# Patient Record
Sex: Female | Born: 1955 | Race: Black or African American | Hispanic: No | Marital: Single | Smoking: Current every day smoker
Health system: Southern US, Community
[De-identification: ages and names within clinical notes are randomized; demographics above are authoritative.]

## PROBLEM LIST (undated history)

## (undated) DIAGNOSIS — L0390 Cellulitis, unspecified: Secondary | ICD-10-CM

## (undated) DIAGNOSIS — I10 Essential (primary) hypertension: Secondary | ICD-10-CM

---

## 2005-07-17 ENCOUNTER — Emergency Department: Payer: Self-pay | Admitting: Emergency Medicine

## 2011-03-04 ENCOUNTER — Emergency Department: Payer: Self-pay | Admitting: Emergency Medicine

## 2012-08-13 ENCOUNTER — Emergency Department: Payer: Self-pay | Admitting: Internal Medicine

## 2012-09-11 ENCOUNTER — Emergency Department: Payer: Self-pay | Admitting: Emergency Medicine

## 2020-05-18 ENCOUNTER — Other Ambulatory Visit: Payer: Self-pay | Admitting: Internal Medicine

## 2020-05-18 DIAGNOSIS — R17 Unspecified jaundice: Secondary | ICD-10-CM

## 2020-05-19 ENCOUNTER — Other Ambulatory Visit: Payer: Self-pay

## 2020-05-19 ENCOUNTER — Ambulatory Visit
Admission: RE | Admit: 2020-05-19 | Discharge: 2020-05-19 | Disposition: A | Discharge status: Home / Self Care | Payer: BLUE CROSS/BLUE SHIELD | Source: Ambulatory Visit | Attending: Internal Medicine | Admitting: Internal Medicine

## 2020-05-19 DIAGNOSIS — R17 Unspecified jaundice: Secondary | ICD-10-CM | POA: Diagnosis present

## 2020-05-20 ENCOUNTER — Inpatient Hospital Stay (HOSPITAL_COMMUNITY)
Admission: EM | Admit: 2020-05-20 | Discharge: 2020-05-23 | DRG: 446 | Disposition: A | Discharge status: Home / Self Care | Payer: BLUE CROSS/BLUE SHIELD | Attending: Family Medicine | Admitting: Family Medicine

## 2020-05-20 ENCOUNTER — Encounter (HOSPITAL_COMMUNITY): Payer: Self-pay | Admitting: *Deleted

## 2020-05-20 ENCOUNTER — Other Ambulatory Visit: Payer: Self-pay

## 2020-05-20 DIAGNOSIS — F17200 Nicotine dependence, unspecified, uncomplicated: Secondary | ICD-10-CM | POA: Diagnosis present

## 2020-05-20 DIAGNOSIS — K8020 Calculus of gallbladder without cholecystitis without obstruction: Secondary | ICD-10-CM

## 2020-05-20 DIAGNOSIS — R17 Unspecified jaundice: Secondary | ICD-10-CM | POA: Diagnosis not present

## 2020-05-20 DIAGNOSIS — Z20822 Contact with and (suspected) exposure to covid-19: Secondary | ICD-10-CM | POA: Diagnosis present

## 2020-05-20 DIAGNOSIS — I10 Essential (primary) hypertension: Secondary | ICD-10-CM

## 2020-05-20 DIAGNOSIS — K8050 Calculus of bile duct without cholangitis or cholecystitis without obstruction: Secondary | ICD-10-CM | POA: Diagnosis present

## 2020-05-20 HISTORY — DX: Essential (primary) hypertension: I10

## 2020-05-20 HISTORY — DX: Cellulitis, unspecified: L03.90

## 2020-05-20 LAB — COMPREHENSIVE METABOLIC PANEL
ALT: 149 U/L — ABNORMAL HIGH (ref 0–44)
AST: 111 U/L — ABNORMAL HIGH (ref 15–41)
Albumin: 3.1 g/dL — ABNORMAL LOW (ref 3.5–5.0)
Alkaline Phosphatase: 347 U/L — ABNORMAL HIGH (ref 38–126)
Anion gap: 11 (ref 5–15)
BUN: 11 mg/dL (ref 8–23)
CO2: 21 mmol/L — ABNORMAL LOW (ref 22–32)
Calcium: 9.4 mg/dL (ref 8.9–10.3)
Chloride: 107 mmol/L (ref 98–111)
Creatinine, Ser: 0.63 mg/dL (ref 0.44–1.00)
GFR, Estimated: >60 mL/min (ref >60)
Glucose, Bld: 82 mg/dL (ref 70–99)
Potassium: 3.3 mmol/L — ABNORMAL LOW (ref 3.5–5.1)
Sodium: 139 mmol/L (ref 135–145)
Total Bilirubin: 15.4 mg/dL — ABNORMAL HIGH (ref 0.3–1.2)
Total Protein: 6.7 g/dL (ref 6.5–8.1)

## 2020-05-20 LAB — CBC WITH DIFFERENTIAL/PLATELET
Abs Immature Granulocytes: 0.01 10*3/uL (ref 0.00–0.07)
Basophils Absolute: 0 10*3/uL (ref 0.0–0.1)
Basophils Relative: 0 %
Eosinophils Absolute: 0.1 10*3/uL (ref 0.0–0.5)
Eosinophils Relative: 2 %
HCT: 34.6 % — ABNORMAL LOW (ref 36.0–46.0)
Hemoglobin: 11.4 g/dL — ABNORMAL LOW (ref 12.0–15.0)
Immature Granulocytes: 0 %
Lymphocytes Relative: 25 %
Lymphs Abs: 1.6 10*3/uL (ref 0.7–4.0)
MCH: 26.6 pg (ref 26.0–34.0)
MCHC: 32.9 g/dL (ref 30.0–36.0)
MCV: 80.8 fL (ref 80.0–100.0)
Monocytes Absolute: 0.7 10*3/uL (ref 0.1–1.0)
Monocytes Relative: 12 %
Neutro Abs: 3.8 10*3/uL (ref 1.7–7.7)
Neutrophils Relative %: 61 %
Platelets: 323 10*3/uL (ref 150–400)
RBC: 4.28 MIL/uL (ref 3.87–5.11)
RDW: 20 % — ABNORMAL HIGH (ref 11.5–15.5)
WBC: 6.3 10*3/uL (ref 4.0–10.5)
nRBC: 0 % (ref 0.0–0.2)

## 2020-05-20 LAB — RESPIRATORY PANEL BY RT PCR (FLU A&B, COVID)
Influenza A by PCR: NEGATIVE
Influenza B by PCR: NEGATIVE
SARS Coronavirus 2 by RT PCR: NEGATIVE

## 2020-05-20 LAB — LIPASE, BLOOD: Lipase: 45 U/L (ref 11–51)

## 2020-05-20 LAB — BILIRUBIN, DIRECT: Bilirubin, Direct: 9.1 mg/dL — ABNORMAL HIGH (ref 0.0–0.2)

## 2020-05-20 LAB — PROTIME-INR
INR: 1 (ref 0.8–1.2)
Prothrombin Time: 12.6 seconds (ref 11.4–15.2)

## 2020-05-20 MED ORDER — ACETAMINOPHEN 325 MG PO TABS: 650.0000 mg | ORAL_TABLET | Freq: Four times a day (QID) | ORAL | Status: DC | PRN

## 2020-05-20 MED ORDER — ENOXAPARIN SODIUM 40 MG/0.4ML ~~LOC~~ SOLN
40.0000 mg | SUBCUTANEOUS | Status: DC
  Administered 2020-05-21 – 2020-05-23 (×3): 40 mg via SUBCUTANEOUS
  Filled 2020-05-20 (×3): qty 0.4

## 2020-05-20 MED ORDER — LORAZEPAM 2 MG/ML IJ SOLN: 1.0000 mg | Freq: Once | INTRAMUSCULAR | Status: DC

## 2020-05-20 MED ORDER — HYDRALAZINE HCL 20 MG/ML IJ SOLN
2.0000 mg | INTRAMUSCULAR | Status: DC | PRN
  Administered 2020-05-20: 2 mg via INTRAVENOUS
  Filled 2020-05-20: qty 1

## 2020-05-20 MED ORDER — SODIUM CHLORIDE 0.9 % IV BOLUS
1000.0000 mL | Freq: Once | INTRAVENOUS | Status: AC
Start: 2020-05-20 — End: 2020-05-20
  Administered 2020-05-20: 1000 mL via INTRAVENOUS

## 2020-05-20 MED ORDER — SODIUM CHLORIDE 0.9% FLUSH
3.0000 mL | Freq: Two times a day (BID) | INTRAVENOUS | Status: DC
  Administered 2020-05-21 – 2020-05-23 (×3): 3 mL via INTRAVENOUS

## 2020-05-20 MED ORDER — ACETAMINOPHEN 650 MG RE SUPP: 650.0000 mg | Freq: Four times a day (QID) | RECTAL | Status: DC | PRN

## 2020-05-20 NOTE — ED Notes (Signed)
Yellowish tint noted to pts eyes and skin. No complaints at this time.

## 2020-05-20 NOTE — ED Provider Notes (Signed)
[facility] Provider Note   CSN: [id number] Arrival date & time: 05/20/20  1751     History Chief Complaint  Patient presents with   Abdominal Pain    Shelly Roberts is a 64 y.o. female here presenting with painless jaundice.  Patient is otherwise healthy.  Patient states that for the last week or so she noticed that her eyes were yellow.  She had no abdominal pain or vomiting or fevers.  She had an outpatient labs done 2 days ago that showed AST of 97 and ALT of 152 and alk phos of 397 and T bili of 15.  She also had a outpatient ultrasound that showed dilated CBD of 18 with a stone in it.  Patient denies any weight loss or fevers.  Patient was told to come to the ER and her daughter wanted her to come here.  The history is provided by the patient.       History reviewed. No pertinent past medical history.  There are no problems to display for this patient.   History reviewed. No pertinent surgical history.   OB History   No obstetric history on file.     No family history on file.  Social History   Tobacco Use   Smoking status: Current Every Day Smoker   Smokeless tobacco: Never Used  Substance Use Topics   Alcohol use: Never   Drug use: Not on file    Home Medications Prior to Admission medications   Not on File    Allergies    Patient has no known allergies.  Review of Systems   Review of Systems  Gastrointestinal:       Jaundice   All other systems reviewed and are negative.   Physical Exam Updated Vital Signs BP (!) 199/93    Pulse 67    Temp 98.1 F (36.7 C) (Oral)    Resp 18    Ht $R'5\' 4"'iN$  (1.626 m)    Wt 66.2 kg    SpO2 99%    BMI 25.06 kg/m   Physical Exam Vitals and nursing note reviewed.  Constitutional:      Comments: Jaundice   HENT:     Head: Normocephalic.     Mouth/Throat:     Mouth: Mucous membranes are moist.  Eyes:     Comments: + scleral icterus   Cardiovascular:     Rate and  Rhythm: Normal rate and regular rhythm.     Heart sounds: Normal heart sounds.  Pulmonary:     Effort: Pulmonary effort is normal.     Breath sounds: Normal breath sounds.  Abdominal:     General: Abdomen is flat.     Palpations: Abdomen is soft.  Skin:    General: Skin is warm.  Neurological:     General: No focal deficit present.     Mental Status: She is oriented to person, place, and time.  Psychiatric:        Mood and Affect: Mood normal.        Behavior: Behavior normal.     ED Results / Procedures / Treatments   Labs (all labs ordered are listed, but only abnormal results are displayed) Labs Reviewed  RESPIRATORY PANEL BY RT PCR (FLU A&B, COVID)  CBC WITH DIFFERENTIAL/PLATELET  COMPREHENSIVE METABOLIC PANEL  LIPASE, BLOOD  BILIRUBIN, DIRECT  PROTIME-INR    EKG None  Radiology US Abdomen Complete  Result Date: 05/19/2020 CLINICAL DATA:  64 year old female with  painless jaundice. EXAM: ABDOMEN ULTRASOUND COMPLETE COMPARISON:  None. FINDINGS: Gallbladder: Multiple shadowing echogenic gallstones, individually up to 14 mm (image 12). Superimposed layering sludge (image 6). Gallbladder wall thickness remains normal. No sonographic Murphy sign elicited. Common bile duct: Diameter: 11 mm, dilated. And a bulky 18 mm shadowing stone is identified within the distal visible duct on image 19. Liver: Moderate to severe intrahepatic ductal dilatation (image 35). No superimposed discrete liver lesion. Background liver echogenicity within normal limits. Portal vein is patent on color Doppler imaging with normal direction of blood flow towards the liver. IVC: No abnormality visualized. Pancreas: Visible pancreatic head and body appear normal (image 43). No pancreatic ductal dilatation is evident. Spleen: Size and appearance within normal limits. Right Kidney: Length: 11.1 cm. Echogenicity within normal limits. No mass or hydronephrosis visualized. Left Kidney: Length: 10.4 cm. Echogenicity  within normal limits. No mass or hydronephrosis visualized. Abdominal aorta: No aneurysm visualized. Other findings: None. IMPRESSION: 1. Dilated CBD and intrahepatic ducts, with evidence of an 18 mm obstructing stone in the mid to distal CBD (choledocholithiasis, image 19). 2. Superimposed gallstones and sludge. No evidence of acute cholecystitis. 3. Visible pancreas has a normal ultrasound appearance. These results will be called to the ordering clinician or representative by the Radiologist Assistant, and communication documented in the PACS or [employer]. Electronically Signed   By: Genevie Ann M.D.   On: 05/19/2020 11:21    Procedures Procedures (including critical care time)  Medications Ordered in ED Medications  sodium chloride 0.9 % bolus 1,000 mL (has no administration in time range)    ED Course  I have reviewed the triage vital signs and the nursing notes.  Pertinent labs & imaging results that were available during my care of the patient were reviewed by me and considered in my medical decision making (see chart for details).    MDM Rules/Calculators/A&P                         Shelly Roberts is a 64 y.o. female who presented with painless jaundice.  Patient noticed jaundice for the last week or so.  Ultrasound showed dilated CBD with an obstructing stone.  Patient has no abdominal pain or tenderness.  Patient actually has no weight loss.  Will consult GI and likely will need ERCP or MRCP. We will repeat LFTs and also get a PT/INR since her liver function was elevated.  9:40 PM Talked to Dr. Watt Climes who recommend MRCP and admission.   10:37 PM CBC unremarkable. Liver function tests pending. Hospitalist to admit for choledocholithiasis.   Final Clinical Impression(s) / ED Diagnoses Final diagnoses:  None    Rx / DC Orders ED Discharge Orders    None       Drenda Freeze, MD 05/20/20 2237

## 2020-05-20 NOTE — H&P (Signed)
History and Physical   Shelly Roberts JME:[id number] DOB: 10-30-1955 DOA: 05/20/2020  PCP: Adin Hector, MD   Patient coming from: Home  Chief Complaint: Painless jaundice  HPI: Shelly Roberts is a 64 y.o. female with medical history significant of hypertension not on medical therapy who presents after several days of progressive painless jaundice.  Patient and family noticed that her skin had began to yellow on 11/17 and this has been progressive.  Denies any pain or other symptoms.  Was seen by physician at [facility] clinic 2 days ago who obtained labs and ultrasound of the abdomen.  Labs showed AST 97, ALT 152, alk phos 397, and T bili of 15.  Ultrasound showed an 18 mm stone in the common bile duct.  Patient was instructed to proceed to the ED.  Patient has come to [facility] on the advice of her daughter who works as a Immunologist.  Patient denies fever, cough, chest pain, shortness of breath, abdominal pain, nausea, constipation, diarrhea.  Does report loose stool starting today as well as yellowing of her urine.  ED Course: Vitals in the ED significant for blood pressure of 229N to 989Q systolic.  CBC showed hemoglobin 11.4.  PT/INR within normal limits.  CMP pending at this time though previous CMP results are above.  Lipase pending, respiratory panel for flu and Covid pending.  Recent ultrasound as above.  MRCP ordered pending.  GI consulted in the ED.  Review of Systems: As per HPI otherwise all other systems reviewed and are negative.  Past Medical History:  Diagnosis Date  . Cellulitis   . Hypertension     History reviewed. No pertinent surgical history.  Social History  reports that she has been smoking. She has never used smokeless tobacco. She reports that she does not drink alcohol. No history on file for drug use.  No Known Allergies  No family history on file. Reviewed on admission  Prior to Admission medications   Not on File    Physical Exam: Vitals:   05/20/20  2000 05/20/20 2100 05/20/20 2115 05/20/20 2215  BP: (!) 180/80 (!) 163/117 (!) 199/93 (!) 187/76  Pulse: 86 60 67 67  Resp: $Remo'17 13 18 19  'ePLAZ$ Temp:      TempSrc:      SpO2: 99% 100% 99% 100%  Weight:      Height:       Physical Exam Constitutional:      General: She is not in acute distress.    Appearance: Normal appearance.  HENT:     Head: Normocephalic and atraumatic.     Mouth/Throat:     Mouth: Mucous membranes are moist.     Pharynx: Oropharynx is clear.  Eyes:     General: Scleral icterus present.     Extraocular Movements: Extraocular movements intact.     Pupils: Pupils are equal, round, and reactive to light.  Cardiovascular:     Rate and Rhythm: Normal rate and regular rhythm.     Pulses: Normal pulses.     Heart sounds: Normal heart sounds.  Pulmonary:     Effort: Pulmonary effort is normal. No respiratory distress.     Breath sounds: Normal breath sounds.  Abdominal:     General: Bowel sounds are normal. There is no distension.     Palpations: Abdomen is soft.     Tenderness: There is no abdominal tenderness.  Musculoskeletal:        General: No swelling or deformity.  Skin:    General: Skin is warm and dry.     Coloration: Skin is jaundiced.  Neurological:     General: No focal deficit present.     Mental Status: Mental status is at baseline.    Labs on Admission: I have personally reviewed following labs and imaging studies  CBC: Recent Labs  Lab 05/20/20 2131  WBC 6.3  NEUTROABS 3.8  HGB 11.4*  HCT 34.6*  MCV 80.8  PLT 161    Basic Metabolic Panel: Recent Labs  Lab 05/20/20 2131  NA 139  K 3.3*  CL 107  CO2 21*  GLUCOSE 82  BUN 11  CREATININE 0.63  CALCIUM 9.4    GFR: Estimated Creatinine Clearance: 69.1 mL/min (by C-G formula based on SCr of 0.63 mg/dL).  Liver Function Tests: Recent Labs  Lab 05/20/20 2131  AST 111*  ALT 149*  ALKPHOS 347*  BILITOT 15.4*  PROT 6.7  ALBUMIN 3.1*    Urine analysis: No results found  for: COLORURINE, APPEARANCEUR, LABSPEC, PHURINE, GLUCOSEU, HGBUR, BILIRUBINUR, KETONESUR, PROTEINUR, UROBILINOGEN, NITRITE, LEUKOCYTESUR  Radiological Exams on Admission: US Abdomen Complete  Result Date: 05/19/2020 CLINICAL DATA:  64 year old female with painless jaundice. EXAM: ABDOMEN ULTRASOUND COMPLETE COMPARISON:  None. FINDINGS: Gallbladder: Multiple shadowing echogenic gallstones, individually up to 14 mm (image 12). Superimposed layering sludge (image 6). Gallbladder wall thickness remains normal. No sonographic Murphy sign elicited. Common bile duct: Diameter: 11 mm, dilated. And a bulky 18 mm shadowing stone is identified within the distal visible duct on image 19. Liver: Moderate to severe intrahepatic ductal dilatation (image 35). No superimposed discrete liver lesion. Background liver echogenicity within normal limits. Portal vein is patent on color Doppler imaging with normal direction of blood flow towards the liver. IVC: No abnormality visualized. Pancreas: Visible pancreatic head and body appear normal (image 43). No pancreatic ductal dilatation is evident. Spleen: Size and appearance within normal limits. Right Kidney: Length: 11.1 cm. Echogenicity within normal limits. No mass or hydronephrosis visualized. Left Kidney: Length: 10.4 cm. Echogenicity within normal limits. No mass or hydronephrosis visualized. Abdominal aorta: No aneurysm visualized. Other findings: None. IMPRESSION: 1. Dilated CBD and intrahepatic ducts, with evidence of an 18 mm obstructing stone in the mid to distal CBD (choledocholithiasis, image 19). 2. Superimposed gallstones and sludge. No evidence of acute cholecystitis. 3. Visible pancreas has a normal ultrasound appearance. These results will be called to the ordering clinician or representative by the Radiologist Assistant, and communication documented in the PACS or [employer]. Electronically Signed   By: Genevie Ann M.D.   On: 05/19/2020 11:21    EKG: Not yet  obtained  Assessment/Plan Active Problems:   Choledocholithiasis   Jaundice   Essential hypertension  Choledocholithiasis > 1 week of progressive painless jaundice as per HPI > Labs 2 days ago at outpatient doctor's office showed elevated LFTs including alk phos of 397 and T bili 15. > Outpatient abdominal ultrasound showed 18 mm gallstone in the common bile duct > GI consulted in the ED who recommended MRCP and will see the patient in the morning > No evidence of cholangitis nor cholecystitis. Will need cholecystectomy (possibly elective, pending work-up) - Appreciate GI recommendations - Supportive care - MRCP - N.p.o. at midnight, ERCP  Hypertension > Known history of hypertension not on medication, hypertensive to the 160s to 200s in ED - PRN hydralazine for SBP >096 systolic  DVT prophylaxis: Lovenox Code Status:   Full  Family Communication:  Discussed with daughter Judeen Hammans, at  bedside.  Of note daughter works at [employer].  Disposition Plan:   Patient is from:  Home  Anticipated DC to:  Home  Anticipated DC date:  Pending clinical course  Anticipated DC barriers: None  Consults called:  GI consulted by EDP  Admission status:  Inpatient, telemetry   Severity of Illness: The appropriate patient status for this patient is INPATIENT. Inpatient status is judged to be reasonable and necessary in order to provide the required intensity of service to ensure the patient's safety. The patient's presenting symptoms, physical exam findings, and initial radiographic and laboratory data in the context of their chronic comorbidities is felt to place them at high risk for further clinical deterioration. Furthermore, it is not anticipated that the patient will be medically stable for discharge from the hospital within 2 midnights of admission. The following factors support the patient status of inpatient.   " The patient's presenting symptoms include jaundice. " The worrisome physical exam  findings include jaundice. " The initial radiographic and laboratory data are worrisome because of elevated LFTs including elevated T bili and alk phos. " The chronic co-morbidities include hypertension.   * I certify that at the point of admission it is my clinical judgment that the patient will require inpatient hospital care spanning beyond 2 midnights from the point of admission due to high intensity of service, high risk for further deterioration and high frequency of surveillance required.Marcelyn Bruins MD [facility]  How to contact the [facility] Attending or Consulting provider [place] or covering provider during after hours [place], for this patient?   1. Check the care team in [facility] and look for a) attending/consulting [facility] provider listed and b) the [facility] team listed 2. Log into [email or web address] and use 's universal password to access. If you do not have the password, please contact the hospital operator. 3. Locate the [facility] provider you are looking for under [facility] and page to a number that you can be directly reached. 4. If you still have difficulty reaching the provider, please page the [facility] (Director on Call) for the Hospitalists listed on amion for assistance.  05/20/2020, 11:00 PM

## 2020-05-20 NOTE — ED Triage Notes (Signed)
The pt was diagnosed with gallstones this past Thursday  In [place] she was told to come here today  She is in no pain she just has some itching in her lower abdomen

## 2020-05-21 ENCOUNTER — Inpatient Hospital Stay (HOSPITAL_COMMUNITY): Payer: BLUE CROSS/BLUE SHIELD

## 2020-05-21 ENCOUNTER — Encounter (HOSPITAL_COMMUNITY): Payer: Self-pay | Admitting: Internal Medicine

## 2020-05-21 DIAGNOSIS — K8050 Calculus of bile duct without cholangitis or cholecystitis without obstruction: Secondary | ICD-10-CM | POA: Diagnosis not present

## 2020-05-21 LAB — COMPREHENSIVE METABOLIC PANEL
ALT: 138 U/L — ABNORMAL HIGH (ref 0–44)
AST: 108 U/L — ABNORMAL HIGH (ref 15–41)
Albumin: 2.9 g/dL — ABNORMAL LOW (ref 3.5–5.0)
Alkaline Phosphatase: 318 U/L — ABNORMAL HIGH (ref 38–126)
Anion gap: 10 (ref 5–15)
BUN: <5 mg/dL — ABNORMAL LOW (ref 8–23)
CO2: 21 mmol/L — ABNORMAL LOW (ref 22–32)
Calcium: 9.1 mg/dL (ref 8.9–10.3)
Chloride: 108 mmol/L (ref 98–111)
Creatinine, Ser: 0.6 mg/dL (ref 0.44–1.00)
GFR, Estimated: >60 mL/min (ref >60)
Glucose, Bld: 104 mg/dL — ABNORMAL HIGH (ref 70–99)
Potassium: 3 mmol/L — ABNORMAL LOW (ref 3.5–5.1)
Sodium: 139 mmol/L (ref 135–145)
Total Bilirubin: 15.5 mg/dL — ABNORMAL HIGH (ref 0.3–1.2)
Total Protein: 6.1 g/dL — ABNORMAL LOW (ref 6.5–8.1)

## 2020-05-21 LAB — CBC
HCT: 33.1 % — ABNORMAL LOW (ref 36.0–46.0)
Hemoglobin: 11.2 g/dL — ABNORMAL LOW (ref 12.0–15.0)
MCH: 27.1 pg (ref 26.0–34.0)
MCHC: 33.8 g/dL (ref 30.0–36.0)
MCV: 80.1 fL (ref 80.0–100.0)
Platelets: 312 10*3/uL (ref 150–400)
RBC: 4.13 MIL/uL (ref 3.87–5.11)
RDW: 19.9 % — ABNORMAL HIGH (ref 11.5–15.5)
WBC: 6.2 10*3/uL (ref 4.0–10.5)
nRBC: 0 % (ref 0.0–0.2)

## 2020-05-21 LAB — HIV ANTIBODY (ROUTINE TESTING W REFLEX): HIV Screen 4th Generation wRfx: NONREACTIVE

## 2020-05-21 MED ORDER — POTASSIUM CHLORIDE 10 MEQ/100ML IV SOLN
10.0000 meq | INTRAVENOUS | Status: AC
  Administered 2020-05-21 (×3): 10 meq via INTRAVENOUS
  Filled 2020-05-21 (×3): qty 100

## 2020-05-21 MED ORDER — GADOBUTROL 1 MMOL/ML IV SOLN
7.0000 mL | Freq: Once | INTRAVENOUS | Status: AC | PRN
  Administered 2020-05-21: 7 mL via INTRAVENOUS

## 2020-05-21 MED ORDER — SODIUM CHLORIDE 0.9 % IV SOLN: INTRAVENOUS | Status: DC

## 2020-05-21 NOTE — Consult Note (Signed)
Reason for Consult: Obstructive jaundice probable CBD stone Referring Physician: Hospital team  Shelly Roberts is an 64 y.o. female.  HPI: Patient seen and examined in hospital computer chart reviewed and case discussed with hospital team as well as the patient's daughter on the phone and she has not had any previous GI issues or work-up and specifically has not had an endoscopy or colonoscopy before and was unaware that she had gallstones and has never had a liver problem and has not donated blood and her daughter who is a nurse thought she was jaundice and liver test confirmed that an ultrasound showed gallstones and probable CBD stone and she presented to the emergency room and MRCP was done which confirmed the problem and specifically the patient has not had any pain weight loss change in bowel habits fever chills night sweats or any other complaint and had only noticed dark urine for 1 day and did wake up itching yesterday but that is better today  Past Medical History:  Diagnosis Date  . Cellulitis   . Hypertension     History reviewed. No pertinent surgical history.  History reviewed. No pertinent family history.  Social History:  reports that she has been smoking. She has never used smokeless tobacco. She reports that she does not drink alcohol. No history on file for drug use.  Allergies: No Known Allergies  Medications: I have reviewed the patient's current medications.  Results for orders placed or performed during the hospital encounter of 05/20/20 (from the past 48 hour(s))  CBC with Differential/Platelet     Status: Abnormal   Collection Time: 05/20/20  9:31 PM  Result Value Ref Range   WBC 6.3 4.0 - 10.5 K/uL   RBC 4.28 3.87 - 5.11 MIL/uL   Hemoglobin 11.4 (L) 12.0 - 15.0 g/dL   HCT 16.134.6 (L) 36 - 46 %   MCV 80.8 80.0 - 100.0 fL   MCH 26.6 26.0 - 34.0 pg   MCHC 32.9 30.0 - 36.0 g/dL   RDW 09.620.0 (H) 04.511.5 - 40.915.5 %   Platelets 323 150 - 400 K/uL   nRBC 0.0 0.0 - 0.2 %    Neutrophils Relative % 61 %   Neutro Abs 3.8 1.7 - 7.7 K/uL   Lymphocytes Relative 25 %   Lymphs Abs 1.6 0.7 - 4.0 K/uL   Monocytes Relative 12 %   Monocytes Absolute 0.7 0.1 - 1.0 K/uL   Eosinophils Relative 2 %   Eosinophils Absolute 0.1 0.0 - 0.5 K/uL   Basophils Relative 0 %   Basophils Absolute 0.0 0.0 - 0.1 K/uL   Immature Granulocytes 0 %   Abs Immature Granulocytes 0.01 0.00 - 0.07 K/uL    Comment: Performed at [facility], [address] [place], [place] [id number]  Comprehensive metabolic panel     Status: Abnormal   Collection Time: 05/20/20  9:31 PM  Result Value Ref Range   Sodium 139 135 - 145 mmol/L   Potassium 3.3 (L) 3.5 - 5.1 mmol/L   Chloride 107 98 - 111 mmol/L   CO2 21 (L) 22 - 32 mmol/L   Glucose, Bld 82 70 - 99 mg/dL    Comment: Glucose reference range applies only to samples taken after fasting for at least 8 hours.   BUN 11 8 - 23 mg/dL   Creatinine, Ser 4.780.63 0.44 - 1.00 mg/dL   Calcium 9.4 8.9 - 29.510.3 mg/dL   Total Protein 6.7 6.5 - 8.1 g/dL  Albumin 3.1 (L) 3.5 - 5.0 g/dL   AST 578 (H) 15 - 41 U/L   ALT 149 (H) 0 - 44 U/L   Alkaline Phosphatase 347 (H) 38 - 126 U/L   Total Bilirubin 15.4 (H) 0.3 - 1.2 mg/dL   GFR, Estimated >46 >96 mL/min    Comment: (NOTE) Calculated using the CKD-EPI Creatinine Equation (2021)    Anion gap 11 5 - 15    Comment: Performed at [facility], [address] [place], [place] [address]  Lipase, blood     Status: None   Collection Time: 05/20/20  9:31 PM  Result Value Ref Range   Lipase 45 11 - 51 U/L    Comment: Performed at [facility], [address], [place], [place] [address]  Bilirubin, direct     Status: Abnormal   Collection Time: 05/20/20  9:31 PM  Result Value Ref Range   Bilirubin, Direct 9.1 (H) 0.0 - 0.2 mg/dL    Comment: Performed at [facility], [address], [place], [place] [address]  Protime-INR     Status: None   Collection Time: 05/20/20  9:31 PM  Result Value Ref  Range   Prothrombin Time 12.6 11.4 - 15.2 seconds   INR 1.0 0.8 - 1.2    Comment: (NOTE) INR goal varies based on device and disease states. Performed at [facility], [address], [place], [place] [address]   Respiratory Panel by RT PCR (Flu A&B, Covid) - Nasopharyngeal Swab     Status: None   Collection Time: 05/20/20  9:44 PM   Specimen: Nasopharyngeal Swab; Nasopharyngeal(NP) swabs in vial transport medium  Result Value Ref Range   SARS Coronavirus 2 by RT PCR NEGATIVE NEGATIVE    Comment: (NOTE) SARS-CoV-2 target nucleic acids are NOT DETECTED.  The SARS-CoV-2 RNA is generally detectable in upper respiratoy specimens during the acute phase of infection. The lowest concentration of SARS-CoV-2 viral copies this assay can detect is 131 copies/mL. A negative result does not preclude SARS-Cov-2 infection and should not be used as the sole basis for treatment or other patient management decisions. A negative result may occur with  improper specimen collection/handling, submission of specimen other than nasopharyngeal swab, presence of viral mutation(s) within the areas targeted by this assay, and inadequate number of viral copies (<131 copies/mL). A negative result must be combined with clinical observations, patient history, and epidemiological information. The expected result is Negative.  Fact Sheet for Patients:  https://www.moore.com/  Fact Sheet for Healthcare Providers:  https://www.young.biz/  This test is no t yet approved or cleared by the [place] FDA and  has been authorized for detection and/or diagnosis of SARS-CoV-2 by FDA under an Emergency Use Authorization (EUA). This EUA will remain  in effect (meaning this test can be used) for the duration of the COVID-19 declaration under Section 564(b)(1) of the Act, 21 U.S.C. section 360bbb-3(b)(1), unless the authorization is terminated or revoked sooner.      Influenza A by PCR NEGATIVE NEGATIVE   Influenza B by PCR NEGATIVE NEGATIVE    Comment: (NOTE) The Xpert Xpress SARS-CoV-2/FLU/RSV assay is intended as an aid in  the diagnosis of influenza from Nasopharyngeal swab specimens and  should not be used as a sole basis for treatment. Nasal washings and  aspirates are unacceptable for Xpert Xpress SARS-CoV-2/FLU/RSV  testing.  Fact Sheet for Patients: https://www.moore.com/  Fact Sheet for Healthcare Providers: https://www.young.biz/  This test is not  yet approved or cleared by the [place] and  has been authorized for detection and/or diagnosis of SARS-CoV-2 by  FDA under an Emergency Use Authorization (EUA). This EUA will remain  in effect (meaning this test can be used) for the duration of the  Covid-19 declaration under Section 564(b)(1) of the Act, 21  U.S.C. section 360bbb-3(b)(1), unless the authorization is  terminated or revoked. Performed at [facility], [address] [place], [place] [address]   HIV Antibody (routine testing w rflx)     Status: None   Collection Time: 05/20/20 11:58 PM  Result Value Ref Range   HIV Screen 4th Generation wRfx Non Reactive Non Reactive    Comment: Performed at [facility], [address] [place], [place] [address]  Comprehensive metabolic panel     Status: Abnormal   Collection Time: 05/21/20  5:36 AM  Result Value Ref Range   Sodium 139 135 - 145 mmol/L   Potassium 3.0 (L) 3.5 - 5.1 mmol/L   Chloride 108 98 - 111 mmol/L   CO2 21 (L) 22 - 32 mmol/L   Glucose, Bld 104 (H) 70 - 99 mg/dL    Comment: Glucose reference range applies only to samples taken after fasting for at least 8 hours.   BUN <5 (L) 8 - 23 mg/dL   Creatinine, Ser 8.18 0.44 - 1.00 mg/dL   Calcium 9.1 8.9 - 56.3 mg/dL   Total Protein 6.1 (L) 6.5 - 8.1 g/dL   Albumin 2.9 (L) 3.5 - 5.0 g/dL   AST 149 (H) 15 - 41 U/L   ALT 138 (H) 0 - 44 U/L   Alkaline  Phosphatase 318 (H) 38 - 126 U/L   Total Bilirubin 15.5 (H) 0.3 - 1.2 mg/dL   GFR, Estimated >70 >26 mL/min    Comment: (NOTE) Calculated using the CKD-EPI Creatinine Equation (2021)    Anion gap 10 5 - 15    Comment: Performed at [facility], [address], [place], [place] [address]  CBC     Status: Abnormal   Collection Time: 05/21/20  5:36 AM  Result Value Ref Range   WBC 6.2 4.0 - 10.5 K/uL   RBC 4.13 3.87 - 5.11 MIL/uL   Hemoglobin 11.2 (L) 12.0 - 15.0 g/dL   HCT 85.0 (L) 36 - 46 %   MCV 80.1 80.0 - 100.0 fL   MCH 27.1 26.0 - 34.0 pg   MCHC 33.8 30.0 - 36.0 g/dL   RDW 27.7 (H) 41.2 - 87.8 %   Platelets 312 150 - 400 K/uL   nRBC 0.0 0.0 - 0.2 %    Comment: Performed at [facility], [address], [place], [place] [address]    US Abdomen Complete  Result Date: 05/19/2020 CLINICAL DATA:  64 year old female with painless jaundice. EXAM: ABDOMEN ULTRASOUND COMPLETE COMPARISON:  None. FINDINGS: Gallbladder: Multiple shadowing echogenic gallstones, individually up to 14 mm (image 12). Superimposed layering sludge (image 6). Gallbladder wall thickness remains normal. No sonographic Murphy sign elicited. Common bile duct: Diameter: 11 mm, dilated. And a bulky 18 mm shadowing stone is identified within the distal visible duct on image 19. Liver: Moderate to severe intrahepatic ductal dilatation (image 35). No superimposed discrete liver lesion. Background liver echogenicity within normal limits. Portal vein is patent on color Doppler imaging with normal direction of blood flow towards the liver. IVC: No abnormality visualized. Pancreas: Visible pancreatic head and body appear normal (image 43).  No pancreatic ductal dilatation is evident. Spleen: Size and appearance within normal limits. Right Kidney: Length: 11.1 cm. Echogenicity within normal limits. No mass or hydronephrosis visualized. Left Kidney: Length: 10.4 cm. Echogenicity within normal limits. No mass or hydronephrosis  visualized. Abdominal aorta: No aneurysm visualized. Other findings: None. IMPRESSION: 1. Dilated CBD and intrahepatic ducts, with evidence of an 18 mm obstructing stone in the mid to distal CBD (choledocholithiasis, image 19). 2. Superimposed gallstones and sludge. No evidence of acute cholecystitis. 3. Visible pancreas has a normal ultrasound appearance. These results will be called to the ordering clinician or representative by the Radiologist Assistant, and communication documented in the PACS or [employer]. Electronically Signed   By: Odessa Fleming M.D.   On: 05/19/2020 11:21   MR ABDOMEN MRCP W WO CONTAST  Result Date: 05/21/2020 CLINICAL DATA:  Abdominal pain, biliary obstruction, CBD stone on ultrasound EXAM: MRI ABDOMEN WITHOUT AND WITH CONTRAST (INCLUDING MRCP) TECHNIQUE: Multiplanar multisequence MR imaging of the abdomen was performed both before and after the administration of intravenous contrast. Heavily T2-weighted images of the biliary and pancreatic ducts were obtained, and three-dimensional MRCP images were rendered by post processing. CONTRAST:  11mL GADAVIST GADOBUTROL 1 MMOL/ML IV SOLN COMPARISON:  Abdominal ultrasound, 05/19/2020 FINDINGS: Lower chest: No acute findings. Hepatobiliary: No mass or other parenchymal abnormality identified. There is severe intra and extrahepatic biliary ductal dilatation. There is a large gallstone present in the midportion of the common bile duct, approximately 3.5 cm from the ampulla, measuring 1.5 x 1.3 cm (series 5, image 15). Multiple additional gallstones and sludge in the gallbladder. Pancreas: No mass, inflammatory changes, or other parenchymal abnormality identified. No pancreatic ductal dilatation. Spleen:  Within normal limits in size and appearance. Adrenals/Urinary Tract: No masses identified. No evidence of hydronephrosis. Stomach/Bowel: Visualized portions within the abdomen are unremarkable. Vascular/Lymphatic: No pathologically enlarged lymph  nodes identified. No abdominal aortic aneurysm demonstrated. Other:  None. Musculoskeletal: No suspicious bone lesions identified. IMPRESSION: 1. Severe intra and extrahepatic biliary ductal dilatation. There is a large gallstone in the midportion of the common bile duct, approximately 3.5 cm from the ampulla, measuring 1.5 x 1.3 cm. 2. Multiple additional gallstones and sludge in the gallbladder. Preliminary findings were reported by Dr. Kreg Shropshire at 2:36 a.m., 07/22/2019. Electronically Signed   By: Lauralyn Primes M.D.   On: 05/21/2020 10:42    Review of Systems negative except above Blood pressure (!) 145/71, pulse 60, temperature 98.5 F (36.9 C), temperature source Oral, resp. rate 18, height 5\' 4"  (1.626 m), weight 66.2 kg, SpO2 97 %. Physical Exam vital signs stable afebrile no acute distress exam pertinent for her abdomen being soft nontender labs MRI and ultrasound report reviewed  Assessment/Plan: Obstructive jaundice probably secondary to large CBD stone Plan: The risk benefits methods and success rate of ERCP sphincterotomy possible stenting was discussed with both the patient and her daughter and will proceed tomorrow at probably 10 AM with further work-up and plans pending those findings and we did discuss how ultimately she will need her gallbladder out but can probably wait till after Thanksgiving unless symptoms change and have it done electively in the future  [facility] E 05/21/2020, 10:53 AM

## 2020-05-21 NOTE — ED Notes (Signed)
Assuming care of patient at this time. Pt in NAD. Bed in low position. Call bell within reach.

## 2020-05-21 NOTE — Progress Notes (Signed)
PROGRESS NOTE    Shelly Roberts  ASN:[id number] DOB: January 31, 1956 DOA: 05/20/2020 PCP: Lynnea Ferrier, MD    Brief Narrative:  This 64 years old female with medical history significant for hypertension but not on any medications presents with several day history of progressive painless jaundice.  She has seen the physician at [facility] clinic 2 days ago who ordered labs and ultrasound which showed elevated liver enzymes with 18 mm stone in the common bile duct.  Patient was sent to the ED.  She is admitted for choledocholithiasis.  GI is consulted. She is scheduled to have ERCP tomorrow.  Assessment & Plan:   Active Problems:   Choledocholithiasis   Jaundice   Essential hypertension   Choledocholithiasis: Patient presents with 1 week history of progressive painless jaundice . Patient denies any symptoms, nausea, vomiting or abdominal pain. She is seen physician in [place] clinic 2 days ago who ordered the labs. Outpatient abdominal ultrasound showed 18 mm gallstone in the common bile duct GI consulted in the ED who recommended MRCP :  MRCP: Severe intra and extrahepatic biliary ductal dilatation. There is a large gallstone in the midportion of the common bile duct. No evidence of cholangitis nor cholecystitis.  Patient is n.p.o. midnight scheduled for ERCP tomorrow. Will eventually need cholecystectomy after Thanksgiving.  Hypertension She report history of hypertension but not on any blood pressure medications. We will continue hydralazine as needed as needed.  DVT prophylaxis: Lovenox Code Status: Full code Family Communication: None at bedside Disposition Plan:     Status is: Inpatient  Remains inpatient appropriate because:Inpatient level of care appropriate due to severity of illness   Dispo: The patient is from: Home              Anticipated d/c is to: [facility]              Anticipated d/c date is: 2 days              Patient currently is not medically stable to  d/c.  Consultants:   Gastroenterology  Procedures: Scheduled ERCP 11/ 22 Antimicrobials:   Anti-infectives (From admission, onward)   None      Subjective: Patient was seen and examined at bedside.  Overnight events noted.  Patient denies any abdominal pain nausea vomiting.  LFTs are trending down.  Objective: Vitals:   05/21/20 0427 05/21/20 0449 05/21/20 0851 05/21/20 1209  BP: (!) 153/96 (!) 166/99 (!) 145/71 (!) 159/87  Pulse: (!) 57 (!) 56 60 (!) 56  Resp: 17 18  17   Temp: 98 F (36.7 C) 98.5 F (36.9 C)  98.7 F (37.1 C)  TempSrc: Oral Oral  Oral  SpO2: 100% 97%  99%  Weight:      Height:        Intake/Output Summary (Last 24 hours) at 05/21/2020 1512 Last data filed at 05/21/2020 1500 Gross per 24 hour  Intake 1017.78 ml  Output --  Net 1017.78 ml   Filed Weights   05/20/20 1801  Weight: 66.2 kg    Examination:  General exam: Appears calm and comfortable  Respiratory system: Clear to auscultation. Respiratory effort normal. Cardiovascular system: S1 & S2 heard, RRR. No JVD, murmurs, rubs, gallops or clicks. No pedal edema. Gastrointestinal system: Abdomen is nondistended, soft and nontender. No organomegaly or masses felt. Normal bowel sounds heard. Central nervous system: Alert and oriented. No focal neurological deficits. Extremities: No edema, no cyanosis, no clubbing Skin: No rashes, lesions or ulcers Psychiatry:  Judgement and insight appear normal. Mood & affect appropriate.     Data Reviewed: I have personally reviewed following labs and imaging studies  CBC: Recent Labs  Lab 05/20/20 2131 05/21/20 0536  WBC 6.3 6.2  NEUTROABS 3.8  --   HGB 11.4* 11.2*  HCT 34.6* 33.1*  MCV 80.8 80.1  PLT 323 312   Basic Metabolic Panel: Recent Labs  Lab 05/20/20 2131 05/21/20 0536  NA 139 139  K 3.3* 3.0*  CL 107 108  CO2 21* 21*  GLUCOSE 82 104*  BUN 11 <5*  CREATININE 0.63 0.60  CALCIUM 9.4 9.1   GFR: Estimated Creatinine  Clearance: 69.1 mL/min (by C-G formula based on SCr of 0.6 mg/dL). Liver Function Tests: Recent Labs  Lab 05/20/20 2131 05/21/20 0536  AST 111* 108*  ALT 149* 138*  ALKPHOS 347* 318*  BILITOT 15.4* 15.5*  PROT 6.7 6.1*  ALBUMIN 3.1* 2.9*   Recent Labs  Lab 05/20/20 2131  LIPASE 45   No results for input(s): AMMONIA in the last 168 hours. Coagulation Profile: Recent Labs  Lab 05/20/20 2131  INR 1.0   Cardiac Enzymes: No results for input(s): CKTOTAL, CKMB, CKMBINDEX, TROPONINI in the last 168 hours. BNP (last 3 results) No results for input(s): PROBNP in the last 8760 hours. HbA1C: No results for input(s): HGBA1C in the last 72 hours. CBG: No results for input(s): GLUCAP in the last 168 hours. Lipid Profile: No results for input(s): CHOL, HDL, LDLCALC, TRIG, CHOLHDL, LDLDIRECT in the last 72 hours. Thyroid Function Tests: No results for input(s): TSH, T4TOTAL, FREET4, T3FREE, THYROIDAB in the last 72 hours. Anemia Panel: No results for input(s): VITAMINB12, FOLATE, FERRITIN, TIBC, IRON, RETICCTPCT in the last 72 hours. Sepsis Labs: No results for input(s): PROCALCITON, LATICACIDVEN in the last 168 hours.  Recent Results (from the past 240 hour(s))  Respiratory Panel by RT PCR (Flu A&B, Covid) - Nasopharyngeal Swab     Status: None   Collection Time: 05/20/20  9:44 PM   Specimen: Nasopharyngeal Swab; Nasopharyngeal(NP) swabs in vial transport medium  Result Value Ref Range Status   SARS Coronavirus 2 by RT PCR NEGATIVE NEGATIVE Final    Comment: (NOTE) SARS-CoV-2 target nucleic acids are NOT DETECTED.  The SARS-CoV-2 RNA is generally detectable in upper respiratoy specimens during the acute phase of infection. The lowest concentration of SARS-CoV-2 viral copies this assay can detect is 131 copies/mL. A negative result does not preclude SARS-Cov-2 infection and should not be used as the sole basis for treatment or other patient management decisions. A negative  result may occur with  improper specimen collection/handling, submission of specimen other than nasopharyngeal swab, presence of viral mutation(s) within the areas targeted by this assay, and inadequate number of viral copies (<131 copies/mL). A negative result must be combined with clinical observations, patient history, and epidemiological information. The expected result is Negative.  Fact Sheet for Patients:  https://www.moore.com/  Fact Sheet for Healthcare Providers:  https://www.young.biz/  This test is no t yet approved or cleared by the [place] FDA and  has been authorized for detection and/or diagnosis of SARS-CoV-2 by FDA under an Emergency Use Authorization (EUA). This EUA will remain  in effect (meaning this test can be used) for the duration of the COVID-19 declaration under Section 564(b)(1) of the Act, 21 U.S.C. section 360bbb-3(b)(1), unless the authorization is terminated or revoked sooner.     Influenza A by PCR NEGATIVE NEGATIVE Final   Influenza B by PCR NEGATIVE NEGATIVE  Final    Comment: (NOTE) The Xpert Xpress SARS-CoV-2/FLU/RSV assay is intended as an aid in  the diagnosis of influenza from Nasopharyngeal swab specimens and  should not be used as a sole basis for treatment. Nasal washings and  aspirates are unacceptable for Xpert Xpress SARS-CoV-2/FLU/RSV  testing.  Fact Sheet for Patients: https://www.moore.com/  Fact Sheet for Healthcare Providers: https://www.young.biz/  This test is not yet approved or cleared by the [place] FDA and  has been authorized for detection and/or diagnosis of SARS-CoV-2 by  FDA under an Emergency Use Authorization (EUA). This EUA will remain  in effect (meaning this test can be used) for the duration of the  Covid-19 declaration under Section 564(b)(1) of the Act, 21  U.S.C. section 360bbb-3(b)(1), unless the authorization is   terminated or revoked. Performed at [facility], [address] [place], [place] [address]     Radiology Studies: MR ABDOMEN MRCP W WO CONTAST  Result Date: 05/21/2020 CLINICAL DATA:  Abdominal pain, biliary obstruction, CBD stone on ultrasound EXAM: MRI ABDOMEN WITHOUT AND WITH CONTRAST (INCLUDING MRCP) TECHNIQUE: Multiplanar multisequence MR imaging of the abdomen was performed both before and after the administration of intravenous contrast. Heavily T2-weighted images of the biliary and pancreatic ducts were obtained, and three-dimensional MRCP images were rendered by post processing. CONTRAST:  42mL GADAVIST GADOBUTROL 1 MMOL/ML IV SOLN COMPARISON:  Abdominal ultrasound, 05/19/2020 FINDINGS: Lower chest: No acute findings. Hepatobiliary: No mass or other parenchymal abnormality identified. There is severe intra and extrahepatic biliary ductal dilatation. There is a large gallstone present in the midportion of the common bile duct, approximately 3.5 cm from the ampulla, measuring 1.5 x 1.3 cm (series 5, image 15). Multiple additional gallstones and sludge in the gallbladder. Pancreas: No mass, inflammatory changes, or other parenchymal abnormality identified. No pancreatic ductal dilatation. Spleen:  Within normal limits in size and appearance. Adrenals/Urinary Tract: No masses identified. No evidence of hydronephrosis. Stomach/Bowel: Visualized portions within the abdomen are unremarkable. Vascular/Lymphatic: No pathologically enlarged lymph nodes identified. No abdominal aortic aneurysm demonstrated. Other:  None. Musculoskeletal: No suspicious bone lesions identified. IMPRESSION: 1. Severe intra and extrahepatic biliary ductal dilatation. There is a large gallstone in the midportion of the common bile duct, approximately 3.5 cm from the ampulla, measuring 1.5 x 1.3 cm. 2. Multiple additional gallstones and sludge in the gallbladder. Preliminary findings were reported by Dr. Kreg Shropshire at  2:36 a.m., 07/22/2019. Electronically Signed   By: Lauralyn Primes M.D.   On: 05/21/2020 10:42   Scheduled Meds: . enoxaparin (LOVENOX) injection  40 mg Subcutaneous Q24H  . LORazepam  1 mg Intravenous Once  . sodium chloride flush  3 mL Intravenous Q12H   Continuous Infusions: . sodium chloride       LOS: 1 day    Time spent: 35 mins    Dallis Czaja, MD [facility]   If 7PM-7AM, please contact night-coverage

## 2020-05-22 ENCOUNTER — Encounter (HOSPITAL_COMMUNITY)
Admission: EM | Disposition: A | Discharge status: Home / Self Care | Payer: Self-pay | Source: Home / Self Care | Attending: Family Medicine

## 2020-05-22 ENCOUNTER — Inpatient Hospital Stay (HOSPITAL_COMMUNITY): Payer: BLUE CROSS/BLUE SHIELD | Admitting: Anesthesiology

## 2020-05-22 ENCOUNTER — Encounter (HOSPITAL_COMMUNITY): Payer: Self-pay | Admitting: Internal Medicine

## 2020-05-22 ENCOUNTER — Inpatient Hospital Stay (HOSPITAL_COMMUNITY): Payer: BLUE CROSS/BLUE SHIELD

## 2020-05-22 DIAGNOSIS — K8050 Calculus of bile duct without cholangitis or cholecystitis without obstruction: Secondary | ICD-10-CM | POA: Diagnosis not present

## 2020-05-22 HISTORY — PX: SPHINCTEROTOMY: SHX5544

## 2020-05-22 HISTORY — PX: ERCP: SHX5425

## 2020-05-22 HISTORY — PX: BILIARY STENT PLACEMENT: SHX5538

## 2020-05-22 LAB — COMPREHENSIVE METABOLIC PANEL
ALT: 140 U/L — ABNORMAL HIGH (ref 0–44)
AST: 107 U/L — ABNORMAL HIGH (ref 15–41)
Albumin: 2.7 g/dL — ABNORMAL LOW (ref 3.5–5.0)
Alkaline Phosphatase: 293 U/L — ABNORMAL HIGH (ref 38–126)
Anion gap: 11 (ref 5–15)
BUN: 10 mg/dL (ref 8–23)
CO2: 20 mmol/L — ABNORMAL LOW (ref 22–32)
Calcium: 9.1 mg/dL (ref 8.9–10.3)
Chloride: 106 mmol/L (ref 98–111)
Creatinine, Ser: 0.63 mg/dL (ref 0.44–1.00)
GFR, Estimated: >60 mL/min (ref >60)
Glucose, Bld: 105 mg/dL — ABNORMAL HIGH (ref 70–99)
Potassium: 3.8 mmol/L (ref 3.5–5.1)
Sodium: 137 mmol/L (ref 135–145)
Total Bilirubin: 13.2 mg/dL — ABNORMAL HIGH (ref 0.3–1.2)
Total Protein: 6.3 g/dL — ABNORMAL LOW (ref 6.5–8.1)

## 2020-05-22 LAB — PHOSPHORUS: Phosphorus: 3.8 mg/dL (ref 2.5–4.6)

## 2020-05-22 LAB — CBC
HCT: 32 % — ABNORMAL LOW (ref 36.0–46.0)
Hemoglobin: 11 g/dL — ABNORMAL LOW (ref 12.0–15.0)
MCH: 27 pg (ref 26.0–34.0)
MCHC: 34.4 g/dL (ref 30.0–36.0)
MCV: 78.4 fL — ABNORMAL LOW (ref 80.0–100.0)
Platelets: 318 10*3/uL (ref 150–400)
RBC: 4.08 MIL/uL (ref 3.87–5.11)
RDW: 20.3 % — ABNORMAL HIGH (ref 11.5–15.5)
WBC: 6.2 10*3/uL (ref 4.0–10.5)
nRBC: 0 % (ref 0.0–0.2)

## 2020-05-22 LAB — MAGNESIUM: Magnesium: 2.2 mg/dL (ref 1.7–2.4)

## 2020-05-22 SURGERY — ERCP, WITH INTERVENTION IF INDICATED
Anesthesia: General

## 2020-05-22 MED ORDER — ONDANSETRON HCL 4 MG/2ML IJ SOLN
INTRAMUSCULAR | Status: DC | PRN
  Administered 2020-05-22: 4 mg via INTRAVENOUS

## 2020-05-22 MED ORDER — PROPOFOL 10 MG/ML IV BOLUS
INTRAVENOUS | Status: DC | PRN
  Administered 2020-05-22: 100 mg via INTRAVENOUS

## 2020-05-22 MED ORDER — MIDAZOLAM HCL 5 MG/5ML IJ SOLN
INTRAMUSCULAR | Status: DC | PRN
  Administered 2020-05-22: 2 mg via INTRAVENOUS

## 2020-05-22 MED ORDER — SODIUM CHLORIDE (PF) 0.9 % IJ SOLN
INTRAMUSCULAR | Status: DC | PRN
  Administered 2020-05-22: 20 mL

## 2020-05-22 MED ORDER — PHENYLEPHRINE HCL-NACL 10-0.9 MG/250ML-% IV SOLN
INTRAVENOUS | Status: DC | PRN
  Administered 2020-05-22: 50 ug/min via INTRAVENOUS

## 2020-05-22 MED ORDER — CIPROFLOXACIN IN D5W 400 MG/200ML IV SOLN
INTRAVENOUS | Status: AC
  Filled 2020-05-22: qty 200

## 2020-05-22 MED ORDER — FENTANYL CITRATE (PF) 250 MCG/5ML IJ SOLN
INTRAMUSCULAR | Status: DC | PRN
  Administered 2020-05-22 (×3): 50 ug via INTRAVENOUS

## 2020-05-22 MED ORDER — CIPROFLOXACIN IN D5W 400 MG/200ML IV SOLN
INTRAVENOUS | Status: DC | PRN
  Administered 2020-05-22: 400 mg via INTRAVENOUS

## 2020-05-22 MED ORDER — LIDOCAINE 2% (20 MG/ML) 5 ML SYRINGE
INTRAMUSCULAR | Status: DC | PRN
  Administered 2020-05-22: 100 mg via INTRAVENOUS

## 2020-05-22 MED ORDER — LISINOPRIL 5 MG PO TABS
5.0000 mg | ORAL_TABLET | Freq: Every day | ORAL | Status: DC
  Administered 2020-05-22 – 2020-05-23 (×2): 5 mg via ORAL
  Filled 2020-05-22 (×2): qty 1

## 2020-05-22 MED ORDER — INDOMETHACIN 50 MG RE SUPP
RECTAL | Status: DC | PRN
  Administered 2020-05-22: 100 mg via RECTAL

## 2020-05-22 MED ORDER — INDOMETHACIN 50 MG RE SUPP
RECTAL | Status: AC
  Filled 2020-05-22: qty 2

## 2020-05-22 MED ORDER — LACTATED RINGERS IV SOLN: INTRAVENOUS | Status: DC | PRN

## 2020-05-22 MED ORDER — SUGAMMADEX SODIUM 200 MG/2ML IV SOLN
INTRAVENOUS | Status: DC | PRN
  Administered 2020-05-22: 200 mg via INTRAVENOUS

## 2020-05-22 MED ORDER — GLUCAGON HCL RDNA (DIAGNOSTIC) 1 MG IJ SOLR
INTRAMUSCULAR | Status: AC
  Filled 2020-05-22: qty 1

## 2020-05-22 MED ORDER — ROCURONIUM BROMIDE 10 MG/ML (PF) SYRINGE
PREFILLED_SYRINGE | INTRAVENOUS | Status: DC | PRN
  Administered 2020-05-22: 60 mg via INTRAVENOUS

## 2020-05-22 MED ORDER — PHENYLEPHRINE 40 MCG/ML (10ML) SYRINGE FOR IV PUSH (FOR BLOOD PRESSURE SUPPORT)
PREFILLED_SYRINGE | INTRAVENOUS | Status: DC | PRN
  Administered 2020-05-22: 120 ug via INTRAVENOUS
  Administered 2020-05-22 (×2): 80 ug via INTRAVENOUS

## 2020-05-22 MED ORDER — LACTATED RINGERS IV SOLN: Freq: Once | INTRAVENOUS | Status: AC

## 2020-05-22 MED ORDER — DEXAMETHASONE SODIUM PHOSPHATE 10 MG/ML IJ SOLN
INTRAMUSCULAR | Status: DC | PRN
  Administered 2020-05-22: 5 mg via INTRAVENOUS

## 2020-05-22 NOTE — Progress Notes (Addendum)
PROGRESS NOTE    Shelly Roberts  ZOX:[id number] DOB: 1955-12-13 DOA: 05/20/2020 PCP: Lynnea FerrierKlein, Bert J III, MD    Brief Narrative:  This 64 years old female with medical history significant for hypertension but not on any medications presents with several day history of progressive painless jaundice.  She has seen the physician at [facility] days ago who ordered labs and ultrasound which showed elevated liver enzymes with 18 mm stone in the common bile duct.  Patient was sent to the ED.  She is admitted for choledocholithiasis.  GI is consulted.  She underwent ERCP , one plastic biliary stent placed.  Tolerated well.  Assessment & Plan:   Active Problems:   Choledocholithiasis   Jaundice   Essential hypertension   Choledocholithiasis: Patient presents with 1 week history of progressive painless jaundice . Patient denies any symptoms, nausea, vomiting or abdominal pain. She is seen physician in [facility] 2 days ago who ordered the labs. Outpatient abdominal ultrasound showed 18 mm gallstone in the common bile duct GI consulted in the ED who recommended MRCP :  MRCP: Severe intra and extrahepatic biliary ductal dilatation. There is a large gallstone in the midportion of the common bile duct. No evidence of cholangitis nor cholecystitis.  Patient underwent ERCP, one plastic biliary stent was placed in CBD, tolerated well. Will eventually need cholecystectomy after Thanksgiving.  Hypertension She report history of hypertension but not on any blood pressure medications. We will continue hydralazine as needed as needed.  DVT prophylaxis: Lovenox Code Status: Full code Family Communication: None at bedside Disposition Plan:     Status is: Inpatient  Remains inpatient appropriate because:Inpatient level of care appropriate due to severity of illness   Dispo: The patient is from: Home              Anticipated d/c is to: [facility]              Anticipated d/c date is: 2  days              Patient currently is not medically stable to d/c.  Consultants:   Gastroenterology  Procedures: Scheduled ERCP 11/ 22 Antimicrobials:   Anti-infectives (From admission, onward)   None      Subjective: Patient was seen and examined at bedside.  Overnight events noted.   Patient denies any abdominal pain, nausea, vomiting.  LFTs are trending down. She was going for ERCP when examined in the morning.  Objective: Vitals:   05/22/20 1311 05/22/20 1321 05/22/20 1331 05/22/20 1408  BP: (!) 170/83 (!) 168/72 (!) 185/87 (!) 180/91  Pulse:  (!) 57 (!) 51 (!) 49  Resp: 14 13 11 16   Temp: 97.7 F (36.5 C)   (!) 97.3 F (36.3 C)  TempSrc: Oral   Oral  SpO2: 100% 99% 98% 99%  Weight:      Height:        Intake/Output Summary (Last 24 hours) at 05/22/2020 1424 Last data filed at 05/22/2020 1315 Gross per 24 hour  Intake 940 ml  Output --  Net 940 ml   Filed Weights   05/20/20 1801 05/22/20 1002  Weight: 66.2 kg 66.2 kg    Examination:  General exam: Appears calm and comfortable  Respiratory system: Clear to auscultation. Respiratory effort normal. Cardiovascular system: S1 & S2 heard, RRR. No JVD, murmurs, rubs, gallops or clicks. No pedal edema. Gastrointestinal system: Abdomen is nondistended, soft and nontender. No organomegaly or masses felt. Normal bowel sounds heard.  Central nervous system: Alert and oriented. No focal neurological deficits. Extremities: No edema, no cyanosis, no clubbing Skin: No rashes, lesions or ulcers Psychiatry: Judgement and insight appear normal. Mood & affect appropriate.     Data Reviewed: I have personally reviewed following labs and imaging studies  CBC: Recent Labs  Lab 05/20/20 2131 05/21/20 0536 05/22/20 0327  WBC 6.3 6.2 6.2  NEUTROABS 3.8  --   --   HGB 11.4* 11.2* 11.0*  HCT 34.6* 33.1* 32.0*  MCV 80.8 80.1 78.4*  PLT 323 312 318   Basic Metabolic Panel: Recent Labs  Lab 05/20/20 2131  05/21/20 0536 05/22/20 0327  NA 139 139 137  K 3.3* 3.0* 3.8  CL 107 108 106  CO2 21* 21* 20*  GLUCOSE 82 104* 105*  BUN 11 <5* 10  CREATININE 0.63 0.60 0.63  CALCIUM 9.4 9.1 9.1  MG  --   --  2.2  PHOS  --   --  3.8   GFR: Estimated Creatinine Clearance: 69.1 mL/min (by C-G formula based on SCr of 0.63 mg/dL). Liver Function Tests: Recent Labs  Lab 05/20/20 2131 05/21/20 0536 05/22/20 0327  AST 111* 108* 107*  ALT 149* 138* 140*  ALKPHOS 347* 318* 293*  BILITOT 15.4* 15.5* 13.2*  PROT 6.7 6.1* 6.3*  ALBUMIN 3.1* 2.9* 2.7*   Recent Labs  Lab 05/20/20 2131  LIPASE 45   No results for input(s): AMMONIA in the last 168 hours. Coagulation Profile: Recent Labs  Lab 05/20/20 2131  INR 1.0   Cardiac Enzymes: No results for input(s): CKTOTAL, CKMB, CKMBINDEX, TROPONINI in the last 168 hours. BNP (last 3 results) No results for input(s): PROBNP in the last 8760 hours. HbA1C: No results for input(s): HGBA1C in the last 72 hours. CBG: No results for input(s): GLUCAP in the last 168 hours. Lipid Profile: No results for input(s): CHOL, HDL, LDLCALC, TRIG, CHOLHDL, LDLDIRECT in the last 72 hours. Thyroid Function Tests: No results for input(s): TSH, T4TOTAL, FREET4, T3FREE, THYROIDAB in the last 72 hours. Anemia Panel: No results for input(s): VITAMINB12, FOLATE, FERRITIN, TIBC, IRON, RETICCTPCT in the last 72 hours. Sepsis Labs: No results for input(s): PROCALCITON, LATICACIDVEN in the last 168 hours.  Recent Results (from the past 240 hour(s))  Respiratory Panel by RT PCR (Flu A&B, Covid) - Nasopharyngeal Swab     Status: None   Collection Time: 05/20/20  9:44 PM   Specimen: Nasopharyngeal Swab; Nasopharyngeal(NP) swabs in vial transport medium  Result Value Ref Range Status   SARS Coronavirus 2 by RT PCR NEGATIVE NEGATIVE Final    Comment: (NOTE) SARS-CoV-2 target nucleic acids are NOT DETECTED.  The SARS-CoV-2 RNA is generally detectable in upper  respiratoy specimens during the acute phase of infection. The lowest concentration of SARS-CoV-2 viral copies this assay can detect is 131 copies/mL. A negative result does not preclude SARS-Cov-2 infection and should not be used as the sole basis for treatment or other patient management decisions. A negative result may occur with  improper specimen collection/handling, submission of specimen other than nasopharyngeal swab, presence of viral mutation(s) within the areas targeted by this assay, and inadequate number of viral copies (<131 copies/mL). A negative result must be combined with clinical observations, patient history, and epidemiological information. The expected result is Negative.  Fact Sheet for Patients:  https://www.moore.com/  Fact Sheet for Healthcare Providers:  https://www.young.biz/  This test is no t yet approved or cleared by the [place] and  has been authorized for  detection and/or diagnosis of SARS-CoV-2 by FDA under an Emergency Use Authorization (EUA). This EUA will remain  in effect (meaning this test can be used) for the duration of the COVID-19 declaration under Section 564(b)(1) of the Act, 21 U.S.C. section 360bbb-3(b)(1), unless the authorization is terminated or revoked sooner.     Influenza A by PCR NEGATIVE NEGATIVE Final   Influenza B by PCR NEGATIVE NEGATIVE Final    Comment: (NOTE) The Xpert Xpress SARS-CoV-2/FLU/RSV assay is intended as an aid in  the diagnosis of influenza from Nasopharyngeal swab specimens and  should not be used as a sole basis for treatment. Nasal washings and  aspirates are unacceptable for Xpert Xpress SARS-CoV-2/FLU/RSV  testing.  Fact Sheet for Patients: https://www.moore.com/  Fact Sheet for Healthcare Providers: https://www.young.biz/  This test is not yet approved or cleared by the [place] FDA and  has been  authorized for detection and/or diagnosis of SARS-CoV-2 by  FDA under an Emergency Use Authorization (EUA). This EUA will remain  in effect (meaning this test can be used) for the duration of the  Covid-19 declaration under Section 564(b)(1) of the Act, 21  U.S.C. section 360bbb-3(b)(1), unless the authorization is  terminated or revoked. Performed at [facility], [address], [place], [place] [address]     Radiology Studies: DG ERCP BILIARY & PANCREATIC DUCTS  Result Date: 05/22/2020 CLINICAL DATA:  Choledocholithiasis EXAM: ERCP TECHNIQUE: Multiple spot images obtained with the fluoroscopic device and submitted for interpretation post-procedure. FLUOROSCOPY TIME:  Fluoroscopy Time:  3 minutes 32 seconds Radiation Exposure Index (if provided by the fluoroscopic device): 88.8 Number of Acquired Spot Images: 0 COMPARISON:  None. FINDINGS: A total of 3 intraoperative saved images are submitted for review. The images demonstrate a flexible duodenal scope in the descending duodenum followed by wire cannulation of the common bile duct. Cholangiography demonstrates biliary ductal dilatation with a filling defect in the distal common bile duct. Subsequent images demonstrate placement of a plastic biliary stent. IMPRESSION: ERCP with placement of a plastic biliary stent. These images were submitted for radiologic interpretation only. Please see the procedural report for the amount of contrast and the fluoroscopy time utilized. Electronically Signed   By: Malachy Moan M.D.   On: 05/22/2020 13:57   MR ABDOMEN MRCP W WO CONTAST  Result Date: 05/21/2020 CLINICAL DATA:  Abdominal pain, biliary obstruction, CBD stone on ultrasound EXAM: MRI ABDOMEN WITHOUT AND WITH CONTRAST (INCLUDING MRCP) TECHNIQUE: Multiplanar multisequence MR imaging of the abdomen was performed both before and after the administration of intravenous contrast. Heavily T2-weighted images of the biliary and pancreatic ducts  were obtained, and three-dimensional MRCP images were rendered by post processing. CONTRAST:  25mL GADAVIST GADOBUTROL 1 MMOL/ML IV SOLN COMPARISON:  Abdominal ultrasound, 05/19/2020 FINDINGS: Lower chest: No acute findings. Hepatobiliary: No mass or other parenchymal abnormality identified. There is severe intra and extrahepatic biliary ductal dilatation. There is a large gallstone present in the midportion of the common bile duct, approximately 3.5 cm from the ampulla, measuring 1.5 x 1.3 cm (series 5, image 15). Multiple additional gallstones and sludge in the gallbladder. Pancreas: No mass, inflammatory changes, or other parenchymal abnormality identified. No pancreatic ductal dilatation. Spleen:  Within normal limits in size and appearance. Adrenals/Urinary Tract: No masses identified. No evidence of hydronephrosis. Stomach/Bowel: Visualized portions within the abdomen are unremarkable. Vascular/Lymphatic: No pathologically enlarged lymph nodes identified. No abdominal aortic aneurysm demonstrated. Other:  None. Musculoskeletal: No suspicious bone lesions identified. IMPRESSION: 1. Severe intra  and extrahepatic biliary ductal dilatation. There is a large gallstone in the midportion of the common bile duct, approximately 3.5 cm from the ampulla, measuring 1.5 x 1.3 cm. 2. Multiple additional gallstones and sludge in the gallbladder. Preliminary findings were reported by Dr. Kreg Shropshire at 2:36 a.m., 07/22/2019. Electronically Signed   By: Lauralyn Primes M.D.   On: 05/21/2020 10:42   Scheduled Meds: . enoxaparin (LOVENOX) injection  40 mg Subcutaneous Q24H  . LORazepam  1 mg Intravenous Once  . sodium chloride flush  3 mL Intravenous Q12H   Continuous Infusions:    LOS: 2 days    Time spent: 25 mins    Lavonda Thal, MD [facility]   If 7PM-7AM, please contact night-coverage

## 2020-05-22 NOTE — Progress Notes (Signed)
Shelly Roberts 11:01 AM  Subjective: Patient remains asymptomatic and has no questions about the procedure  Objective: Vital signs stable afebrile exam please see preassessment evaluation liver tests about the same CBC okay  Assessment: CBD stone  Plan: Okay to proceed with ERCP with anesthesia assistance  [facility]  office [phone] After 5PM or if no answer call [phone]

## 2020-05-22 NOTE — Transfer of Care (Signed)
Immediate Anesthesia Transfer of Care Note  Patient: Shelly Roberts  Procedure(s) Performed: ENDOSCOPIC RETROGRADE CHOLANGIOPANCREATOGRAPHY (ERCP) (N/A ) SPHINCTEROTOMY BILIARY STENT PLACEMENT  Patient Location: Endoscopy Unit  Anesthesia Type:General  Level of Consciousness: awake, alert  and oriented  Airway & Oxygen Therapy: Patient Spontanous Breathing  Post-op Assessment: Report given to RN, Post -op Vital signs reviewed and stable and Patient moving all extremities  Post vital signs: Reviewed and stable  Last Vitals:  Vitals Value Taken Time  BP 170/83 05/22/20 1311  Temp 36.5 C 05/22/20 1311  Pulse 58 05/22/20 1319  Resp 14 05/22/20 1320  SpO2 98 % 05/22/20 1319  Vitals shown include unvalidated device data.  Last Pain:  Vitals:   05/22/20 1311  TempSrc: Oral  PainSc: 0-No pain         Complications: No complications documented.

## 2020-05-22 NOTE — Anesthesia Procedure Notes (Signed)
Procedure Name: Intubation Date/Time: 05/22/2020 11:48 AM Performed by: Kyung Rudd, CRNA Pre-anesthesia Checklist: Patient identified, Emergency Drugs available, Suction available and Patient being monitored Patient Re-evaluated:Patient Re-evaluated prior to induction Oxygen Delivery Method: Circle system utilized Preoxygenation: Pre-oxygenation with 100% oxygen Induction Type: IV induction Ventilation: Mask ventilation without difficulty Laryngoscope Size: Mac and 3 Grade View: Grade I Tube type: Oral Tube size: 7.0 mm Number of attempts: 1 Airway Equipment and Method: Stylet Placement Confirmation: ETT inserted through vocal cords under direct vision,  positive ETCO2 and breath sounds checked- equal and bilateral Secured at: 23 cm Tube secured with: Tape Dental Injury: Teeth and Oropharynx as per pre-operative assessment

## 2020-05-22 NOTE — Anesthesia Postprocedure Evaluation (Signed)
Anesthesia Post Note  Patient: Shelly Roberts  Procedure(s) Performed: ENDOSCOPIC RETROGRADE CHOLANGIOPANCREATOGRAPHY (ERCP) (N/A ) SPHINCTEROTOMY BILIARY STENT PLACEMENT     Patient location during evaluation: PACU Anesthesia Type: General Level of consciousness: awake Pain management: pain level controlled Vital Signs Assessment: post-procedure vital signs reviewed and stable Respiratory status: spontaneous breathing, nonlabored ventilation, respiratory function stable and patient connected to nasal cannula oxygen Cardiovascular status: blood pressure returned to baseline and stable Postop Assessment: no apparent nausea or vomiting Anesthetic complications: no   No complications documented.  Last Vitals:  Vitals:   05/22/20 1331 05/22/20 1408  BP: (!) 185/87 (!) 180/91  Pulse: (!) 51 (!) 49  Resp: 11 16  Temp:  (!) 36.3 C  SpO2: 98% 99%    Last Pain:  Vitals:   05/22/20 1408  TempSrc: Oral  PainSc:                  Shelly Roberts

## 2020-05-22 NOTE — Anesthesia Preprocedure Evaluation (Addendum)
Anesthesia Evaluation  Patient identified by MRN, date of birth, ID band Patient awake    Reviewed: Allergy & Precautions, NPO status , Patient's Chart, lab work & pertinent test results  Airway Mallampati: I  TM Distance: >3 FB Neck ROM: Full    Dental  (+) Loose, Dental Advisory Given,    Pulmonary Current Smoker,    Pulmonary exam normal breath sounds clear to auscultation       Cardiovascular hypertension, Normal cardiovascular exam Rhythm:Regular Rate:Normal  Untreated HTN   Neuro/Psych negative neurological ROS  negative psych ROS   GI/Hepatic negative GI ROS, Neg liver ROS,   Endo/Other  negative endocrine ROS  Renal/GU negative Renal ROS  negative genitourinary   Musculoskeletal negative musculoskeletal ROS (+)   Abdominal   Peds negative pediatric ROS (+)  Hematology negative hematology ROS (+) anemia ,   Anesthesia Other Findings   Reproductive/Obstetrics negative OB ROS                            Anesthesia Physical Anesthesia Plan  ASA: III  Anesthesia Plan: General   Post-op Pain Management:    Induction: Intravenous  PONV Risk Score and Plan: 2 and Ondansetron, Dexamethasone and Treatment may vary due to age or medical condition  Airway Management Planned: Oral ETT  Additional Equipment:   Intra-op Plan:   Post-operative Plan: Extubation in OR  Informed Consent: I have reviewed the patients History and Physical, chart, labs and discussed the procedure including the risks, benefits and alternatives for the proposed anesthesia with the patient or authorized representative who has indicated his/her understanding and acceptance.     Dental advisory given  Plan Discussed with: CRNA and Surgeon  Anesthesia Plan Comments:         Anesthesia Quick Evaluation

## 2020-05-22 NOTE — Op Note (Signed)
[facility] Patient Name: Shelly Roberts Procedure Date : 05/22/2020 MRN: [id number] Attending MD: Vida Rigger , MD Date of Birth: 05-Aug-1955 CSN: [id number] Age: 64 Admit Type: Inpatient Procedure:                ERCP Indications:              Bile duct stone(s), For therapy of bile duct                            stone(s) Providers:                Vida Rigger, MD, Dayton Bailiff, RN, Arlee Muslim                            Tech., Technician, Carmelina Dane, CRNA Referring MD:              Medicines:                General Anesthesia Complications:            No immediate complications. Estimated Blood Loss:     Estimated blood loss: none. Procedure:                Pre-Anesthesia Assessment:                           - Prior to the procedure, a History and Physical                            was performed, and patient medications and                            allergies were reviewed. The patient's tolerance of                            previous anesthesia was also reviewed. The risks                            and benefits of the procedure and the sedation                            options and risks were discussed with the patient.                            All questions were answered, and informed consent                            was obtained. Prior Anticoagulants: The patient has                            taken no previous anticoagulant or antiplatelet                            agents. ASA Grade Assessment: II - A patient with  mild systemic disease. After reviewing the risks                            and benefits, the patient was deemed in                            satisfactory condition to undergo the procedure.                           After obtaining informed consent, the scope was                            passed under direct vision. Throughout the                            procedure, the patient's blood pressure, pulse, and                             oxygen saturations were monitored continuously. The                            [id number]-Q180V ([id number] Olympus Duodenoscope was                            introduced through the mouth, and used to inject                            contrast into and used to cannulate the bile duct.                            The ERCP was technically difficult and complex due                            to a large stone. Successful completion of the                            procedure was aided by performing the maneuvers                            documented (below) in this report. The patient                            tolerated the procedure well. Scope In: Scope Out: Findings:      The major papilla was normal. On initial cannulation the wire went       towards the pancreas and the sphincterotome was repositioned and deep       selective cannulation was obtained and an obvious large stone in the mid       duct was confirmed and we proceeded with a biliary sphincterotomy which       was made with a Hydratome sphincterotome using ERBE electrocautery.       There was no post-sphincterotomy bleeding. We could get the fully bowed       sphincterotome easily in and out of the duct and choledocholithiasis  was       found in a nondilated duct distal. Lithotripsy with a basket-type device       was attempted for multiple tries but was unsuccessful and unfortunately       spyglass and lithotripsy was not available today so we elected to place       one 10 Fr by 9 cm plastic biliary stent with a single external flap and       a single internal flap was placed 8 cm into the common bile duct. Bile       flowed through the stent. The stent was in good position. Impression:               - The major papilla appeared normal.                           - Choledocholithiasis was found. Removal by biliary                            sphincterotomy was not accomplished; a stent was                             inserted.                           - A biliary sphincterotomy was performed.                           -Mechanical lithotripsy was unsuccessful.                           - The biliary tree was not swept due to the stone                            being much larger than the distal duct.                           - One plastic biliary stent was placed into the                            common bile duct. Recommendation:           - Clear liquid diet for 6 hours. If doing well this                            evening and tolerates soft solids could possibly                            even go home this evening                           - Continue present medications.                           - Return to GI clinic PRN. Will follow liver tests  as an outpatient                           - Telephone GI clinic if symptomatic PRN.                           - Repeat ERCP in 4-6 weeks for retreatment with                            spyglass and EHL available. Procedure Code(s):        --- Professional ---                           [phone], Endoscopic retrograde                            cholangiopancreatography (ERCP); with placement of                            endoscopic stent into biliary or pancreatic duct,                            including pre- and post-dilation and guide wire                            passage, when performed, including sphincterotomy,                            when performed, each stent                           [id number], Endoscopic retrograde                            cholangiopancreatography (ERCP); with destruction                            of calculi, any method (eg, mechanical,                            electrohydraulic, lithotripsy) Diagnosis Code(s):        --- Professional ---                           K80.50, Calculus of bile duct without cholangitis                            or cholecystitis without obstruction CPT copyright  2019 American Medical Association. All rights reserved. The codes documented in this report are preliminary and upon coder review may  be revised to meet current compliance requirements. Vida Rigger, MD 05/22/2020 1:11:53 PM This report has been signed electronically. Number of Addenda: 0

## 2020-05-23 DIAGNOSIS — K8050 Calculus of bile duct without cholangitis or cholecystitis without obstruction: Secondary | ICD-10-CM | POA: Diagnosis not present

## 2020-05-23 LAB — COMPREHENSIVE METABOLIC PANEL
ALT: 145 U/L — ABNORMAL HIGH (ref 0–44)
AST: 105 U/L — ABNORMAL HIGH (ref 15–41)
Albumin: 2.9 g/dL — ABNORMAL LOW (ref 3.5–5.0)
Alkaline Phosphatase: 303 U/L — ABNORMAL HIGH (ref 38–126)
Anion gap: 9 (ref 5–15)
BUN: 8 mg/dL (ref 8–23)
CO2: 23 mmol/L (ref 22–32)
Calcium: 9.1 mg/dL (ref 8.9–10.3)
Chloride: 103 mmol/L (ref 98–111)
Creatinine, Ser: 0.63 mg/dL (ref 0.44–1.00)
GFR, Estimated: >60 mL/min (ref >60)
Glucose, Bld: 120 mg/dL — ABNORMAL HIGH (ref 70–99)
Potassium: 4.2 mmol/L (ref 3.5–5.1)
Sodium: 135 mmol/L (ref 135–145)
Total Bilirubin: 11.3 mg/dL — ABNORMAL HIGH (ref 0.3–1.2)
Total Protein: 6.1 g/dL — ABNORMAL LOW (ref 6.5–8.1)

## 2020-05-23 LAB — CBC WITH DIFFERENTIAL/PLATELET
Abs Immature Granulocytes: 0.04 10*3/uL (ref 0.00–0.07)
Basophils Absolute: 0 10*3/uL (ref 0.0–0.1)
Basophils Relative: 0 %
Eosinophils Absolute: 0 10*3/uL (ref 0.0–0.5)
Eosinophils Relative: 0 %
HCT: 32.7 % — ABNORMAL LOW (ref 36.0–46.0)
Hemoglobin: 11.3 g/dL — ABNORMAL LOW (ref 12.0–15.0)
Immature Granulocytes: 1 %
Lymphocytes Relative: 9 %
Lymphs Abs: 0.7 10*3/uL (ref 0.7–4.0)
MCH: 27.2 pg (ref 26.0–34.0)
MCHC: 34.6 g/dL (ref 30.0–36.0)
MCV: 78.8 fL — ABNORMAL LOW (ref 80.0–100.0)
Monocytes Absolute: 0.5 10*3/uL (ref 0.1–1.0)
Monocytes Relative: 7 %
Neutro Abs: 6 10*3/uL (ref 1.7–7.7)
Neutrophils Relative %: 83 %
Platelets: 334 10*3/uL (ref 150–400)
RBC: 4.15 MIL/uL (ref 3.87–5.11)
RDW: 20.4 % — ABNORMAL HIGH (ref 11.5–15.5)
WBC: 7.2 10*3/uL (ref 4.0–10.5)
nRBC: 0 % (ref 0.0–0.2)

## 2020-05-23 MED ORDER — LISINOPRIL 5 MG PO TABS: 5.0000 mg | ORAL_TABLET | Freq: Every day | ORAL | 1 refills | Status: AC

## 2020-05-23 MED ORDER — OXYCODONE HCL 5 MG PO TABS
5.0000 mg | ORAL_TABLET | Freq: Three times a day (TID) | ORAL | 0 refills | Status: AC | PRN
Start: 2020-05-23 — End: 2020-05-28

## 2020-05-23 NOTE — Progress Notes (Signed)
Shelly Roberts 10:16 AM  Subjective: Patient had one chill last night and minimal abdominal pain but is doing fine today and ate her normal breakfast has no new complaints and we rediscussed the procedure  Objective: Vital signs stable afebrile no acute distress abdomen is soft nontender bili slightly down other liver tests about the same  Assessment: Difficult to remove CBD stone status post stenting  Plan: Okay with me to go home we will repeat liver tests in [place] either Friday or Monday and fax them to my office and the warnings of when to call sooner were discussed and based on follow-up liver tests will decide follow-up and timing of repeat ERCP and please call me if I could be of any further assistance with this hospital stay  [facility] E  office [phone] After 5PM or if no answer call [phone]

## 2020-05-23 NOTE — Discharge Instructions (Signed)
Advised to follow-up with primary care physician in 1 week. Advised to follow-up with gastroenterology in 1 week. Patient has been admitted with choledocholithiasis,  underwent ERCP.  gallstone was radiopaque not removed, underwent stent placement Need repeat ERCP this needs to be scheduled. Advised to take pain medication as needed.

## 2020-05-23 NOTE — Progress Notes (Signed)
Discharge instructions reviewed with pt.   Copy of instructions  given to pt and informed scripts sent to her pharmacy by MD.  Pt waiting for her daughter to arrive to take her home.  Pt to call nurses station when daughter arrives and ready to go.

## 2020-05-23 NOTE — Progress Notes (Signed)
Daughter here to pick pt up, pt dinner tray here, pt wanted to eat and will call when ready to go after she eats.

## 2020-05-23 NOTE — Discharge Summary (Signed)
Physician Discharge Summary  Shelly Roberts FFM:[id number] DOB: Jul 07, 1955 DOA: 05/20/2020  PCP: Lynnea Ferrier, MD  Admit date: 05/20/2020   Discharge date: 05/23/2020  Admitted From: Home.  Disposition:  Home.  Recommendations for Outpatient Follow-up:  1. Follow up with PCP in 1-2 weeks. 2. Please obtain BMP/CBC in one week. 3.   Advised to follow-up with gastroenterology in 1 week. 4.   Patient was admitted with choledocholithiasis,  underwent ERCP.  5.   Gallstone was not removed, underwent stent placement 6.   Needs repeat ERCP in 3-4 months, that needs to be scheduled. 7.   Advised to take pain medications as needed. 8.   Patient has been started on 5 mg of lisinopril for blood pressure control.  Home Health: None. Equipment/Devices: None.  Discharge Condition: Good CODE STATUS:Full code Diet recommendation: Heart Healthy   Brief Summary / Hospital course: This 64 years old female with medical history significant for hypertension but not on any medications presents with several day history of progressive painless jaundice.  Patient was completely asymptomatic. She has seen the physician at [facility] clinic 2 days ago who ordered labs and ultrasound which showed elevated liver enzymes with 18 mm stone in the common bile duct.  Patient was sent to the ED.  She was admitted for choledocholithiasis.  Gastroenterology consulted. She underwent ERCP, The biliary tree was not swept due to the stone being much larger than the distal duct. One plastic biliary stent was placed into the common bile duct.  She may need repeat ERCP in 3 to 4 months that needs to be scheduled.  Patient liver enzymes trending down,  Patient feels better, She has tolerated regular diet without any nausea and vomiting.  Patient is cleared from GI to be discharged and follow-up outpatient to schedule repeat ERCP. Patient is being discharged home.  She was managed for below problems  Discharge Diagnoses:   Active Problems:   Choledocholithiasis   Jaundice   Essential hypertension  Choledocholithiasis: Patient presents with 1 week history of progressive painless jaundice . Patient denies any symptoms, nausea, vomiting or abdominal pain. She has seen physician in [place] clinic 2 days ago who ordered the labs. Outpatient abdominal ultrasound showed 18 mm gallstone in the common bile duct GI consulted in the ED who recommended MRCP :  MRCP: Severe intra and extrahepatic biliary ductal dilatation. There is a large gallstone in the midportion of the common bile duct. No evidence of cholangitis nor cholecystitis.  Patient underwent ERCP : The biliary tree was not swept due to the stone being much larger than the distal duct. - One plastic biliary stent was placed into the common bile duct. -Patient needs repeat ERCP in 3 to 4 months. Patient is cleared from GI to be discharged home and follow-up outpatient to schedule repeat ERCP Will eventually need cholecystectomy after Thanksgiving.  Hypertension She report history of hypertension but not on any blood pressure medications. We will continue hydralazine as needed as needed. Started on 5 mg of Lisinopril.   Discharge Instructions  Discharge Instructions    Call MD for:  difficulty breathing, headache or visual disturbances   Complete by: As directed    Call MD for:  persistant dizziness or light-headedness   Complete by: As directed    Call MD for:  persistant nausea and vomiting   Complete by: As directed    Call MD for:  severe uncontrolled pain   Complete by: As directed    Call  MD for:  temperature >100.4   Complete by: As directed    Diet - low sodium heart healthy   Complete by: As directed    Diet Carb Modified   Complete by: As directed    Discharge instructions   Complete by: As directed    Advised to follow-up with primary care physician in 1 week. Advised to follow-up with gastroenterology in 1 week. Patient has  been admitted with cholangitis underwent ERCP is stone was too big to be broken. Need repeat ERCP this needs to be scheduled. Advised to take pain medication as needed.   Increase activity slowly   Complete by: As directed      Allergies as of 05/23/2020   No Known Allergies     Medication List    TAKE these medications   lisinopril 5 MG tablet Commonly known as: ZESTRIL Take 1 tablet (5 mg total) by mouth daily. Start taking on: May 24, 2020   oxyCODONE 5 MG immediate release tablet Commonly known as: Roxicodone Take 1 tablet (5 mg total) by mouth every 8 (eight) hours as needed for up to 5 days.       Follow-up Information    Curtis Sites III, MD Follow up in 1 week(s).   Specialty: Internal Medicine Contact information: [address] [facility] [place] [address] [phone]        Vida Rigger, MD Follow up.   Specialty: Gastroenterology Contact information: [address] [place] [address] [phone]              No Known Allergies  Consultations: Gastroenterology  Procedures/Studies: US Abdomen Complete  Result Date: 05/19/2020 CLINICAL DATA:  64 year old female with painless jaundice. EXAM: ABDOMEN ULTRASOUND COMPLETE COMPARISON:  None. FINDINGS: Gallbladder: Multiple shadowing echogenic gallstones, individually up to 14 mm (image 12). Superimposed layering sludge (image 6). Gallbladder wall thickness remains normal. No sonographic Murphy sign elicited. Common bile duct: Diameter: 11 mm, dilated. And a bulky 18 mm shadowing stone is identified within the distal visible duct on image 19. Liver: Moderate to severe intrahepatic ductal dilatation (image 35). No superimposed discrete liver lesion. Background liver echogenicity within normal limits. Portal vein is patent on color Doppler imaging with normal direction of blood flow towards the liver. IVC: No abnormality visualized. Pancreas: Visible pancreatic  head and body appear normal (image 43). No pancreatic ductal dilatation is evident. Spleen: Size and appearance within normal limits. Right Kidney: Length: 11.1 cm. Echogenicity within normal limits. No mass or hydronephrosis visualized. Left Kidney: Length: 10.4 cm. Echogenicity within normal limits. No mass or hydronephrosis visualized. Abdominal aorta: No aneurysm visualized. Other findings: None. IMPRESSION: 1. Dilated CBD and intrahepatic ducts, with evidence of an 18 mm obstructing stone in the mid to distal CBD (choledocholithiasis, image 19). 2. Superimposed gallstones and sludge. No evidence of acute cholecystitis. 3. Visible pancreas has a normal ultrasound appearance. These results will be called to the ordering clinician or representative by the Radiologist Assistant, and communication documented in the PACS or [employer]. Electronically Signed   By: Odessa Fleming M.D.   On: 05/19/2020 11:21   DG ERCP BILIARY & PANCREATIC DUCTS  Result Date: 05/22/2020 CLINICAL DATA:  Choledocholithiasis EXAM: ERCP TECHNIQUE: Multiple spot images obtained with the fluoroscopic device and submitted for interpretation post-procedure. FLUOROSCOPY TIME:  Fluoroscopy Time:  3 minutes 32 seconds Radiation Exposure Index (if provided by the fluoroscopic device): 88.8 Number of Acquired Spot Images: 0 COMPARISON:  None. FINDINGS:  A total of 3 intraoperative saved images are submitted for review. The images demonstrate a flexible duodenal scope in the descending duodenum followed by wire cannulation of the common bile duct. Cholangiography demonstrates biliary ductal dilatation with a filling defect in the distal common bile duct. Subsequent images demonstrate placement of a plastic biliary stent. IMPRESSION: ERCP with placement of a plastic biliary stent. These images were submitted for radiologic interpretation only. Please see the procedural report for the amount of contrast and the fluoroscopy time utilized.  Electronically Signed   By: Malachy Moan M.D.   On: 05/22/2020 13:57   MR ABDOMEN MRCP W WO CONTAST  Result Date: 05/21/2020 CLINICAL DATA:  Abdominal pain, biliary obstruction, CBD stone on ultrasound EXAM: MRI ABDOMEN WITHOUT AND WITH CONTRAST (INCLUDING MRCP) TECHNIQUE: Multiplanar multisequence MR imaging of the abdomen was performed both before and after the administration of intravenous contrast. Heavily T2-weighted images of the biliary and pancreatic ducts were obtained, and three-dimensional MRCP images were rendered by post processing. CONTRAST:  96mL GADAVIST GADOBUTROL 1 MMOL/ML IV SOLN COMPARISON:  Abdominal ultrasound, 05/19/2020 FINDINGS: Lower chest: No acute findings. Hepatobiliary: No mass or other parenchymal abnormality identified. There is severe intra and extrahepatic biliary ductal dilatation. There is a large gallstone present in the midportion of the common bile duct, approximately 3.5 cm from the ampulla, measuring 1.5 x 1.3 cm (series 5, image 15). Multiple additional gallstones and sludge in the gallbladder. Pancreas: No mass, inflammatory changes, or other parenchymal abnormality identified. No pancreatic ductal dilatation. Spleen:  Within normal limits in size and appearance. Adrenals/Urinary Tract: No masses identified. No evidence of hydronephrosis. Stomach/Bowel: Visualized portions within the abdomen are unremarkable. Vascular/Lymphatic: No pathologically enlarged lymph nodes identified. No abdominal aortic aneurysm demonstrated. Other:  None. Musculoskeletal: No suspicious bone lesions identified. IMPRESSION: 1. Severe intra and extrahepatic biliary ductal dilatation. There is a large gallstone in the midportion of the common bile duct, approximately 3.5 cm from the ampulla, measuring 1.5 x 1.3 cm. 2. Multiple additional gallstones and sludge in the gallbladder. Preliminary findings were reported by Dr. Kreg Shropshire at 2:36 a.m., 07/22/2019. Electronically Signed   By: Lauralyn Primes M.D.   On: 05/21/2020 10:42    ERCP   Subjective: Patient was seen and examined at bedside.  Overnight events noted.  Patient reports feeling much better.  She denies any pain but has tolerated soft diet.  Patient wants to be discharged home.  Discharge Exam: Vitals:   05/22/20 2357 05/23/20 0528  BP: (!) 155/72 (!) 153/77  Pulse: (!) 54   Resp: 18 18  Temp: 98.6 F (37 C) 98.4 F (36.9 C)  SpO2: 98% 98%   Vitals:   05/22/20 1700 05/22/20 1830 05/22/20 2357 05/23/20 0528  BP: (!) 172/76 (!) 153/87 (!) 155/72 (!) 153/77  Pulse: (!) 57 (!) 58 (!) 54   Resp:  16 18 18   Temp:  98.7 F (37.1 C) 98.6 F (37 C) 98.4 F (36.9 C)  TempSrc:  Oral Oral Oral  SpO2:  98% 98% 98%  Weight:      Height:        General: Pt is alert, awake, not in acute distress Cardiovascular: RRR, S1/S2 +, no rubs, no gallops Respiratory: CTA bilaterally, no wheezing, no rhonchi Abdominal: Soft, NT, ND, bowel sounds + Extremities: no edema, no cyanosis    The results of significant diagnostics from this hospitalization (including imaging, microbiology, ancillary and laboratory) are listed below for reference.     Microbiology:  Recent Results (from the past 240 hour(s))  Respiratory Panel by RT PCR (Flu A&B, Covid) - Nasopharyngeal Swab     Status: None   Collection Time: 05/20/20  9:44 PM   Specimen: Nasopharyngeal Swab; Nasopharyngeal(NP) swabs in vial transport medium  Result Value Ref Range Status   SARS Coronavirus 2 by RT PCR NEGATIVE NEGATIVE Final    Comment: (NOTE) SARS-CoV-2 target nucleic acids are NOT DETECTED.  The SARS-CoV-2 RNA is generally detectable in upper respiratoy specimens during the acute phase of infection. The lowest concentration of SARS-CoV-2 viral copies this assay can detect is 131 copies/mL. A negative result does not preclude SARS-Cov-2 infection and should not be used as the sole basis for treatment or other patient management decisions. A negative  result may occur with  improper specimen collection/handling, submission of specimen other than nasopharyngeal swab, presence of viral mutation(s) within the areas targeted by this assay, and inadequate number of viral copies (<131 copies/mL). A negative result must be combined with clinical observations, patient history, and epidemiological information. The expected result is Negative.  Fact Sheet for Patients:  https://www.moore.com/https://www.fda.gov/media/142436/download  Fact Sheet for Healthcare Providers:  https://www.young.biz/https://www.fda.gov/media/142435/download  This test is no t yet approved or cleared by the [place] FDA and  has been authorized for detection and/or diagnosis of SARS-CoV-2 by FDA under an Emergency Use Authorization (EUA). This EUA will remain  in effect (meaning this test can be used) for the duration of the COVID-19 declaration under Section 564(b)(1) of the Act, 21 U.S.C. section 360bbb-3(b)(1), unless the authorization is terminated or revoked sooner.     Influenza A by PCR NEGATIVE NEGATIVE Final   Influenza B by PCR NEGATIVE NEGATIVE Final    Comment: (NOTE) The Xpert Xpress SARS-CoV-2/FLU/RSV assay is intended as an aid in  the diagnosis of influenza from Nasopharyngeal swab specimens and  should not be used as a sole basis for treatment. Nasal washings and  aspirates are unacceptable for Xpert Xpress SARS-CoV-2/FLU/RSV  testing.  Fact Sheet for Patients: https://www.moore.com/https://www.fda.gov/media/142436/download  Fact Sheet for Healthcare Providers: https://www.young.biz/https://www.fda.gov/media/142435/download  This test is not yet approved or cleared by the [place] FDA and  has been authorized for detection and/or diagnosis of SARS-CoV-2 by  FDA under an Emergency Use Authorization (EUA). This EUA will remain  in effect (meaning this test can be used) for the duration of the  Covid-19 declaration under Section 564(b)(1) of the Act, 21  U.S.C. section 360bbb-3(b)(1), unless the authorization is   terminated or revoked. Performed at [facility], [address] [place], [place] [address]      Labs: BNP (last 3 results) No results for input(s): BNP in the last 8760 hours. Basic Metabolic Panel: Recent Labs  Lab 05/20/20 2131 05/21/20 0536 05/22/20 0327 05/23/20 0244  NA 139 139 137 135  K 3.3* 3.0* 3.8 4.2  CL 107 108 106 103  CO2 21* 21* 20* 23  GLUCOSE 82 104* 105* 120*  BUN 11 <5* 10 8  CREATININE 0.63 0.60 0.63 0.63  CALCIUM 9.4 9.1 9.1 9.1  MG  --   --  2.2  --   PHOS  --   --  3.8  --    Liver Function Tests: Recent Labs  Lab 05/20/20 2131 05/21/20 0536 05/22/20 0327 05/23/20 0244  AST 111* 108* 107* 105*  ALT 149* 138* 140* 145*  ALKPHOS 347* 318* 293* 303*  BILITOT 15.4* 15.5* 13.2* 11.3*  PROT 6.7 6.1* 6.3* 6.1*  ALBUMIN 3.1* 2.9*  2.7* 2.9*   Recent Labs  Lab 05/20/20 2131  LIPASE 45   No results for input(s): AMMONIA in the last 168 hours. CBC: Recent Labs  Lab 05/20/20 2131 05/21/20 0536 05/22/20 0327 05/23/20 0244  WBC 6.3 6.2 6.2 7.2  NEUTROABS 3.8  --   --  6.0  HGB 11.4* 11.2* 11.0* 11.3*  HCT 34.6* 33.1* 32.0* 32.7*  MCV 80.8 80.1 78.4* 78.8*  PLT 323 312 318 334   Cardiac Enzymes: No results for input(s): CKTOTAL, CKMB, CKMBINDEX, TROPONINI in the last 168 hours. BNP: Invalid input(s): POCBNP CBG: No results for input(s): GLUCAP in the last 168 hours. D-Dimer No results for input(s): DDIMER in the last 72 hours. Hgb A1c No results for input(s): HGBA1C in the last 72 hours. Lipid Profile No results for input(s): CHOL, HDL, LDLCALC, TRIG, CHOLHDL, LDLDIRECT in the last 72 hours. Thyroid function studies No results for input(s): TSH, T4TOTAL, T3FREE, THYROIDAB in the last 72 hours.  Invalid input(s): FREET3 Anemia work up No results for input(s): VITAMINB12, FOLATE, FERRITIN, TIBC, IRON, RETICCTPCT in the last 72 hours. Urinalysis No results found for: COLORURINE, APPEARANCEUR, LABSPEC, PHURINE, GLUCOSEU,  HGBUR, BILIRUBINUR, KETONESUR, PROTEINUR, UROBILINOGEN, NITRITE, LEUKOCYTESUR Sepsis Labs Invalid input(s): PROCALCITONIN,  WBC,  LACTICIDVEN Microbiology Recent Results (from the past 240 hour(s))  Respiratory Panel by RT PCR (Flu A&B, Covid) - Nasopharyngeal Swab     Status: None   Collection Time: 05/20/20  9:44 PM   Specimen: Nasopharyngeal Swab; Nasopharyngeal(NP) swabs in vial transport medium  Result Value Ref Range Status   SARS Coronavirus 2 by RT PCR NEGATIVE NEGATIVE Final    Comment: (NOTE) SARS-CoV-2 target nucleic acids are NOT DETECTED.  The SARS-CoV-2 RNA is generally detectable in upper respiratoy specimens during the acute phase of infection. The lowest concentration of SARS-CoV-2 viral copies this assay can detect is 131 copies/mL. A negative result does not preclude SARS-Cov-2 infection and should not be used as the sole basis for treatment or other patient management decisions. A negative result may occur with  improper specimen collection/handling, submission of specimen other than nasopharyngeal swab, presence of viral mutation(s) within the areas targeted by this assay, and inadequate number of viral copies (<131 copies/mL). A negative result must be combined with clinical observations, patient history, and epidemiological information. The expected result is Negative.  Fact Sheet for Patients:  https://www.moore.com/  Fact Sheet for Healthcare Providers:  https://www.young.biz/  This test is no t yet approved or cleared by the [place] FDA and  has been authorized for detection and/or diagnosis of SARS-CoV-2 by FDA under an Emergency Use Authorization (EUA). This EUA will remain  in effect (meaning this test can be used) for the duration of the COVID-19 declaration under Section 564(b)(1) of the Act, 21 U.S.C. section 360bbb-3(b)(1), unless the authorization is terminated or revoked sooner.     Influenza A by  PCR NEGATIVE NEGATIVE Final   Influenza B by PCR NEGATIVE NEGATIVE Final    Comment: (NOTE) The Xpert Xpress SARS-CoV-2/FLU/RSV assay is intended as an aid in  the diagnosis of influenza from Nasopharyngeal swab specimens and  should not be used as a sole basis for treatment. Nasal washings and  aspirates are unacceptable for Xpert Xpress SARS-CoV-2/FLU/RSV  testing.  Fact Sheet for Patients: https://www.moore.com/  Fact Sheet for Healthcare Providers: https://www.young.biz/  This test is not yet approved or cleared by the [place] FDA and  has been authorized for detection and/or diagnosis of SARS-CoV-2 by  FDA under an  Emergency Use Authorization (EUA). This EUA will remain  in effect (meaning this test can be used) for the duration of the  Covid-19 declaration under Section 564(b)(1) of the Act, 21  U.S.C. section 360bbb-3(b)(1), unless the authorization is  terminated or revoked. Performed at [facility], [address], [place], [place] [address]      Time coordinating discharge: Over 30 minutes  SIGNED:   Cipriano Bunker, MD  [facility] 05/23/2020, 10:47 AM Pager   If 7PM-7AM, please contact night-coverage [email or web address]

## 2020-05-24 ENCOUNTER — Encounter (HOSPITAL_COMMUNITY): Payer: Self-pay | Admitting: Gastroenterology

## 2021-03-06 ENCOUNTER — Ambulatory Visit: Payer: BLUE CROSS/BLUE SHIELD | Attending: Internal Medicine

## 2021-03-06 ENCOUNTER — Other Ambulatory Visit: Payer: Self-pay | Admitting: Internal Medicine

## 2021-03-06 DIAGNOSIS — R7989 Other specified abnormal findings of blood chemistry: Secondary | ICD-10-CM

## 2021-03-06 DIAGNOSIS — M79605 Pain in left leg: Secondary | ICD-10-CM

## 2021-07-14 IMAGING — US US ABDOMEN COMPLETE
1 series · 13 of 25 positions shown · non-contrast
Comparison: None.

CLINICAL DATA: 63-year-old female with painless jaundice.

EXAM:
ABDOMEN ULTRASOUND COMPLETE

[Series 1: us abdomen complete · 0.20mm/px · 13 of 82 slices shown]
[im 1/82]
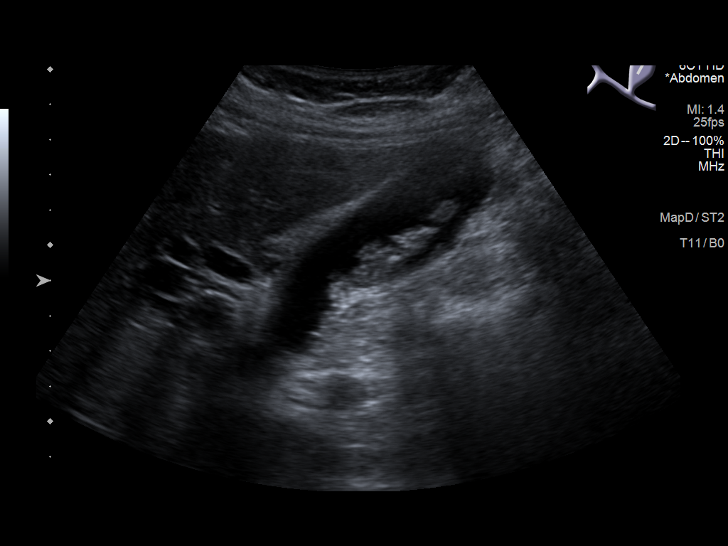
[im 7/82]
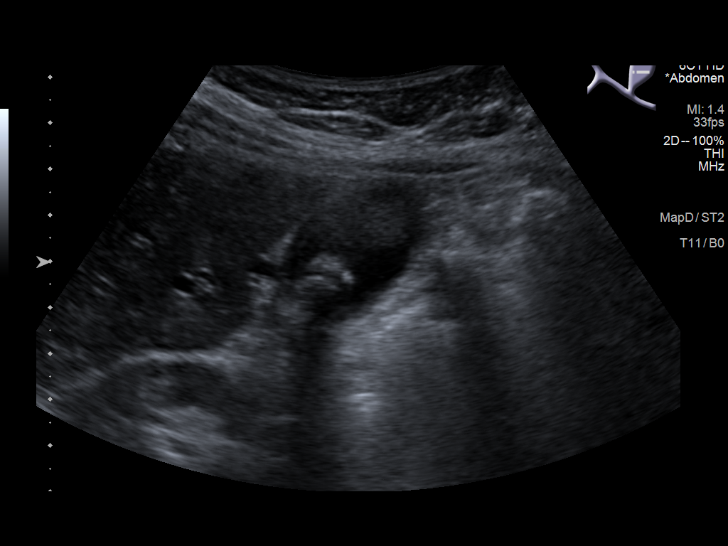
[im 14/82]
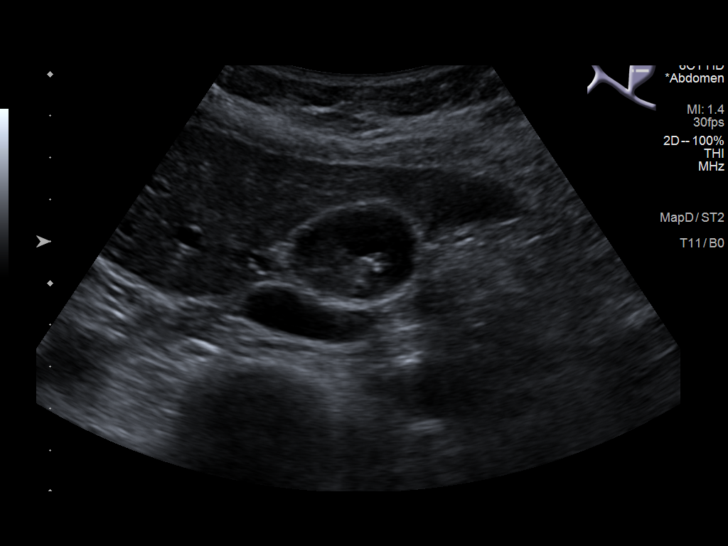
[im 21/82]
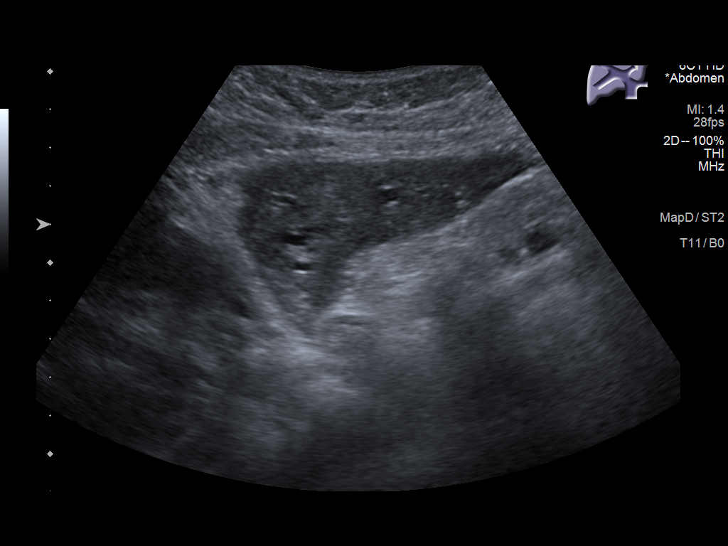
[im 28/82]
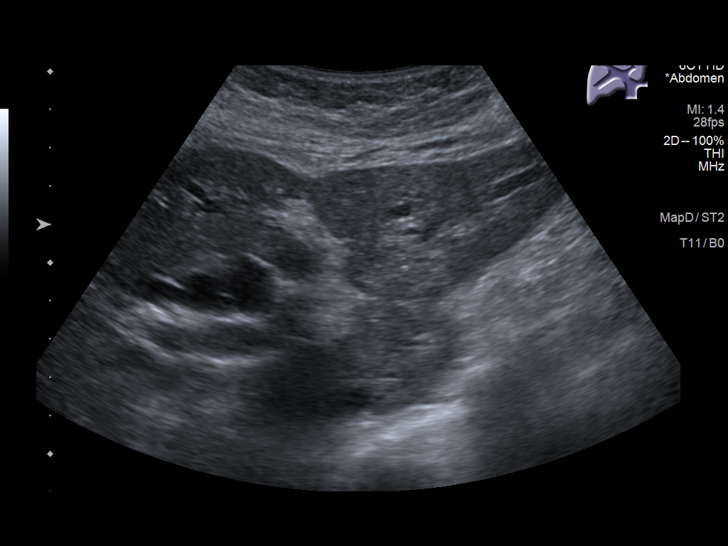
[im 34/82]
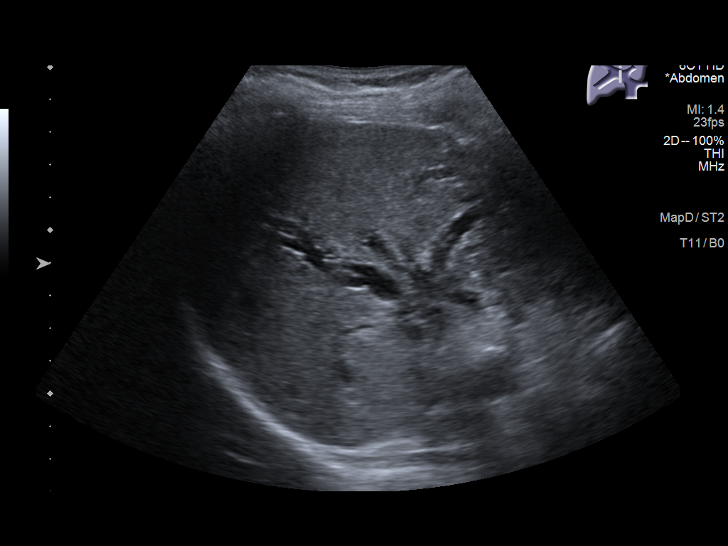
[im 41/82]
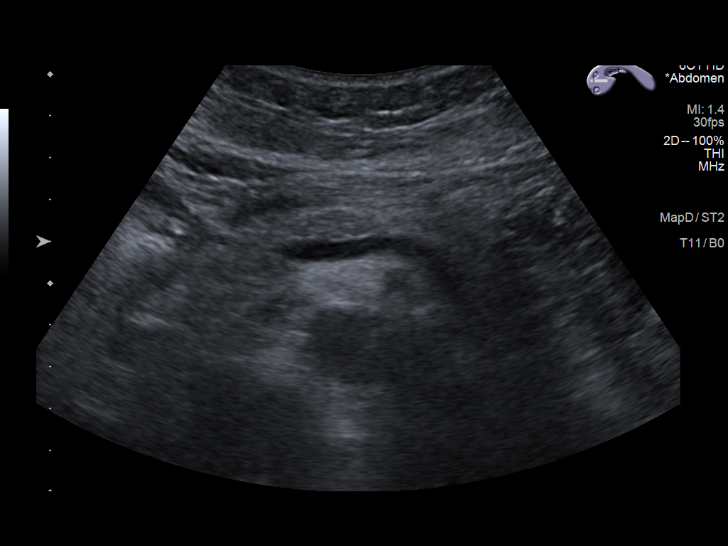
[im 48/82]
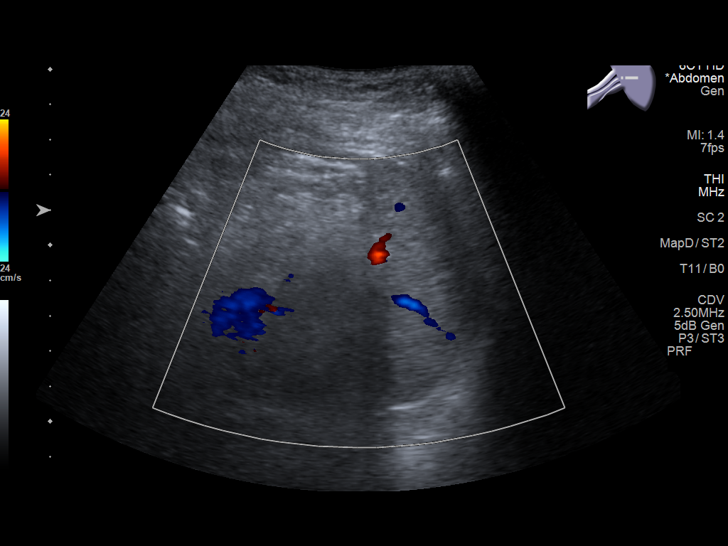
[im 55/82]
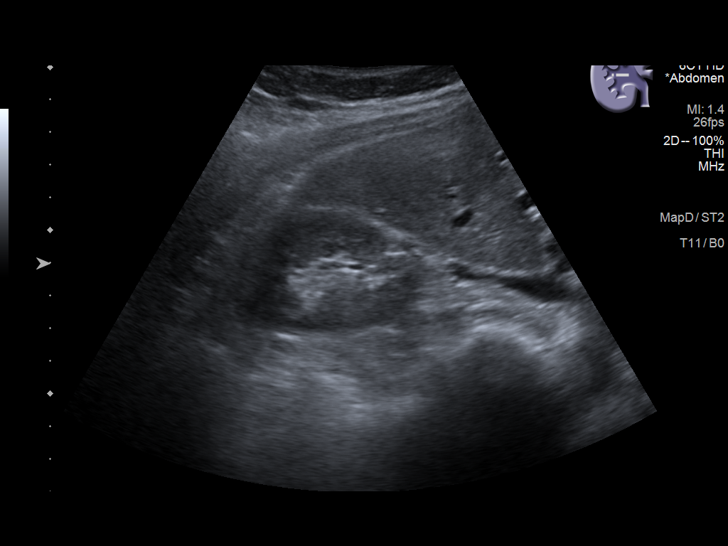
[im 61/82]
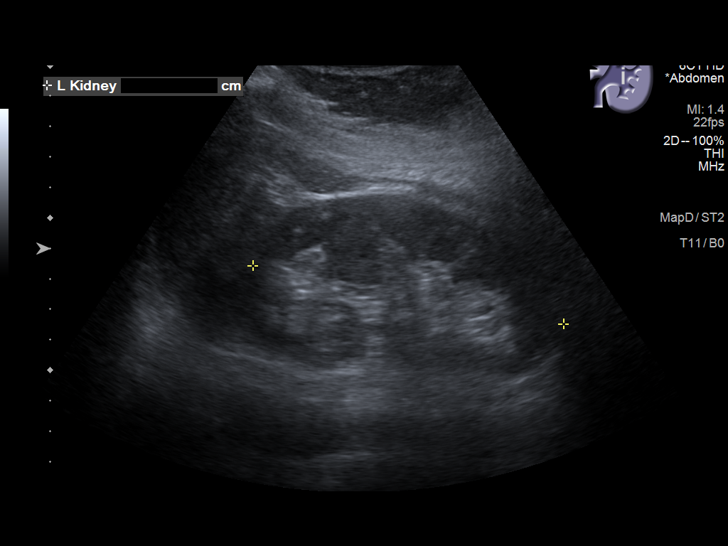
[im 68/82]
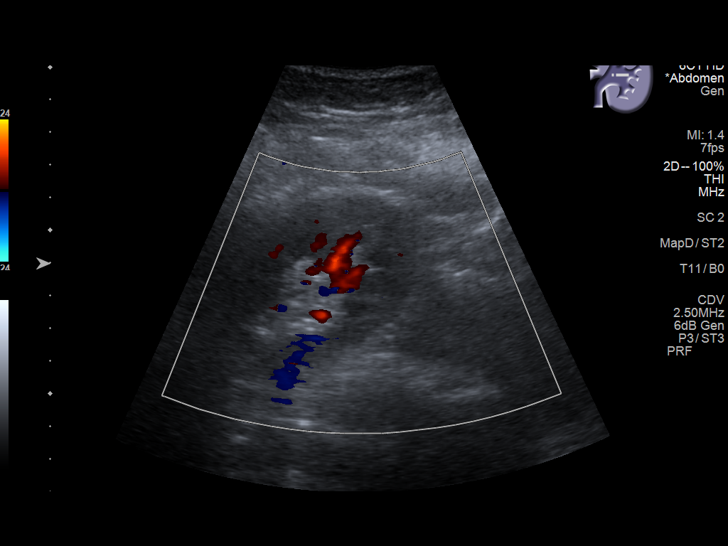
[im 75/82]
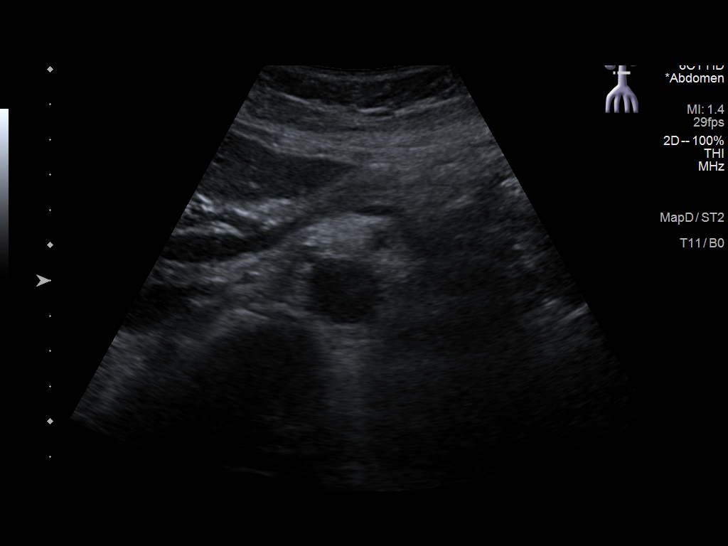
[im 82/82]
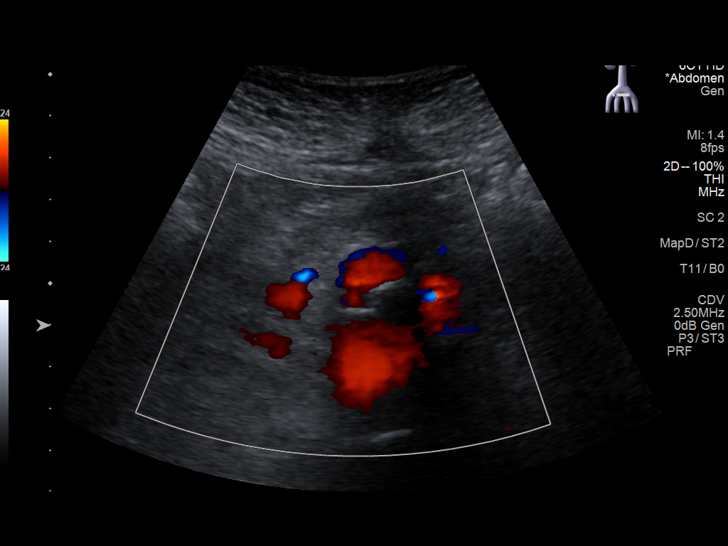

[13 of 25 positions shown; findings below may reference images not displayed]

FINDINGS: Gallbladder: Multiple shadowing echogenic gallstones, individually
up to 14 mm (image 12). Superimposed layering sludge (image 6).
Gallbladder wall thickness remains normal. No sonographic Murphy
sign elicited.

Common bile duct: Diameter: 11 mm, dilated. And a bulky 18 mm
shadowing stone is identified within the distal visible duct on
image 19.

Liver: Moderate to severe intrahepatic ductal dilatation (image 35).
No superimposed discrete liver lesion. Background liver echogenicity
within normal limits. Portal vein is patent on color Doppler
imaging with normal direction of blood flow towards the liver.

IVC: No abnormality visualized.

Pancreas: Visible pancreatic head and body appear normal (image 43).
No pancreatic ductal dilatation is evident.

Spleen: Size and appearance within normal limits.

Right Kidney: Length: 11.1 cm. Echogenicity within normal limits. No
mass or hydronephrosis visualized.

Left Kidney: Length: 10.4 cm. Echogenicity within normal limits. No
mass or hydronephrosis visualized.

Abdominal aorta: No aneurysm visualized.

Other findings: None.
IMPRESSION: 1. Dilated CBD and intrahepatic ducts, with evidence of an 18 mm
obstructing stone in the mid to distal CBD (choledocholithiasis,
image 19).
2. Superimposed gallstones and sludge. No evidence of acute
cholecystitis.
3. Visible pancreas has a normal ultrasound appearance.

These results will be called to the ordering clinician or
representative by the Radiologist Assistant, and communication
documented in the PACS or [REDACTED].

## 2021-07-16 IMAGING — MR MR ABDOMEN WO/W CM MRCP
20 of 23 series · 44 of 48 positions shown · IV contrast (gadavist)
Comparison: Abdominal ultrasound, 05/19/2020

CLINICAL DATA: Abdominal pain, biliary obstruction, CBD stone on
ultrasound

EXAM:
MRI ABDOMEN WITHOUT AND WITH CONTRAST (INCLUDING MRCP)
TECHNIQUE: Multiplanar multisequence MR imaging of the abdomen was performed
both before and after the administration of intravenous contrast.
Heavily T2-weighted images of the biliary and pancreatic ducts were
obtained, and three-dimensional MRCP images were rendered by post
processing.
CONTRAST:  7mL GADAVIST GADOBUTROL 1 MMOL/ML IV SOLN

[Series 4: cor haste · coronal · 6.0mm · 1.25mm/px · 1 of 30 slices shown (1 of 2)]
[im 1/30]
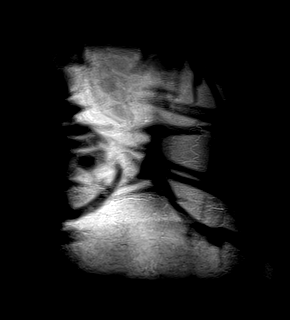

[Series 5: ax haste · axial · 6.0mm · 1.19mm/px · 1 of 32 slices shown]
[im 1/32]
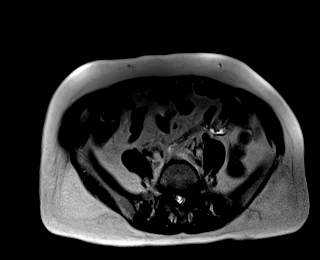

[Series 6: cor haste · coronal · 6.0mm · 1.25mm/px · 1 of 30 slices shown (2 of 2)]
[im 1/30]
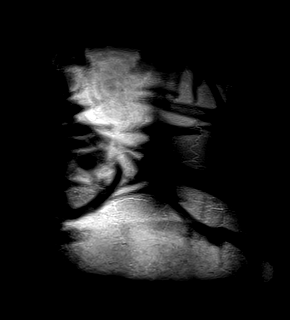

[Series 7: ax in and · axial · 3.0mm · 1.19mm/px · z∈[-180,+33]mm · 2 of 72 slices shown (1 of 2)]
[im 1/72]
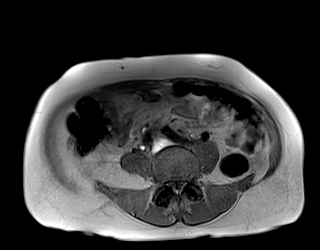
[im 72/72]
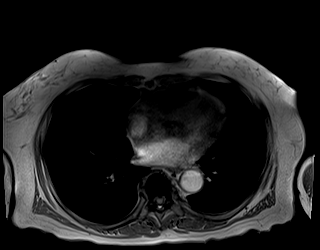

[Series 7: ax in and · axial · 3.0mm · 1.19mm/px · z∈[-180,+33]mm · 2 of 72 slices shown (2 of 2)]
[im 1/72]
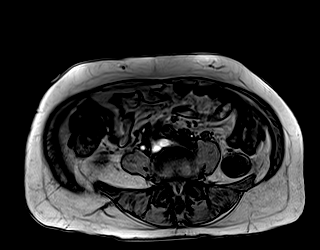
[im 72/72]
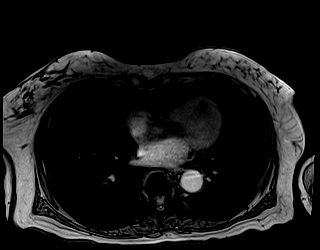

[Series 9: DWI · axial · 6.0mm · 1.42mm/px · z∈[-161,+47]mm · 3 of 90 slices shown (1 of 2)]
[im 1/90]
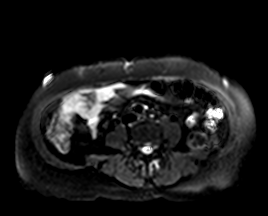
[im 45/90]
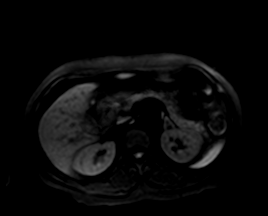
[im 90/90]
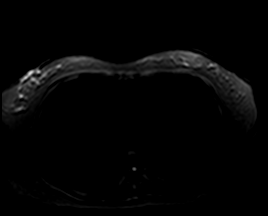

[Series 10: DWI · axial · 6.0mm · 1.42mm/px · 1 of 30 slices shown (2 of 2)]
[im 1/30]
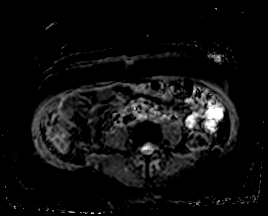

[Series 11: MRCP · coronal · 4.0mm · 1.12mm/px · 1 of 15 slices shown]
[im 1/15]
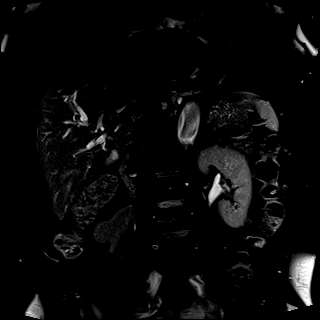

[Series 14: T2 fat-sat · axial · 6.0mm · 1.19mm/px · 1 of 30 slices shown]
[im 1/30]
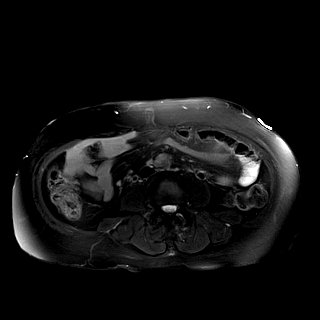

[Series 19: radials · coronal · 50.0mm · 0.78mm/px · 1 of 5 slices shown]
[im 1/5]
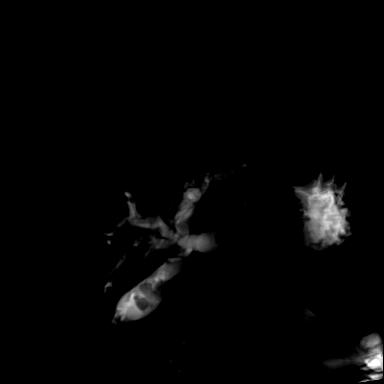

[Series 20: T1 dynamic · axial · non-contrast · 3.0mm · 1.12mm/px · z∈[-200,+12]mm · 3 of 72 slices shown (1 of 5)]
[im 1/72]
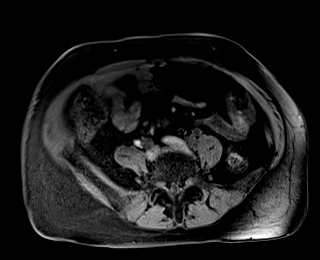
[im 36/72]
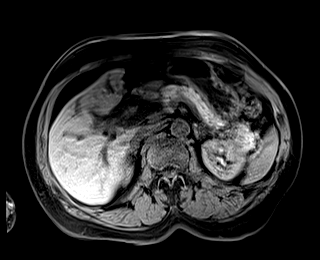
[im 72/72]
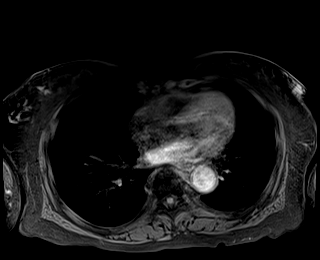

[Series 22: T1 dynamic post-contrast · axial · 3.0mm · 1.12mm/px · z∈[-200,+12]mm · 3 of 72 slices shown (1 of 5)]
[im 1/72]
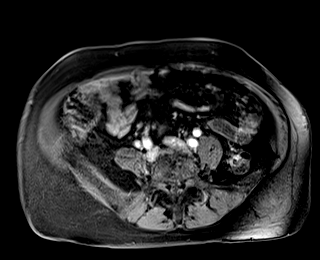
[im 36/72]
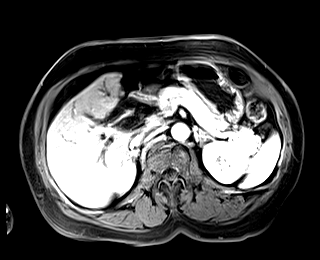
[im 72/72]
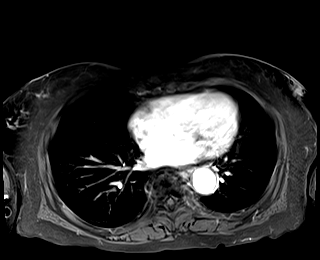

[Series 23: T1 dynamic · axial · 3.0mm · 1.12mm/px · z∈[-200,+12]mm · 3 of 72 slices shown (2 of 5)]
[im 1/72]
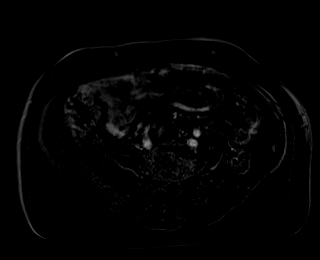
[im 36/72]
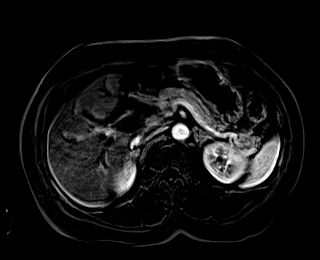
[im 72/72]
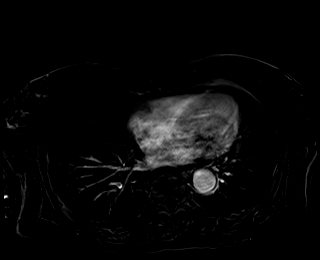

[Series 24: T1 dynamic post-contrast · axial · 3.0mm · 1.12mm/px · z∈[-200,+12]mm · 3 of 72 slices shown (2 of 5)]
[im 1/72]
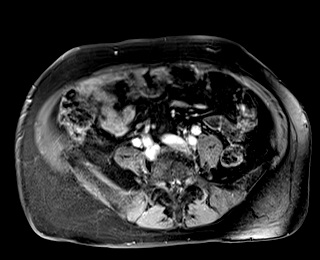
[im 36/72]
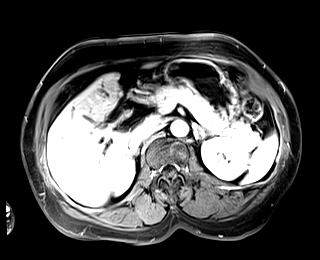
[im 72/72]
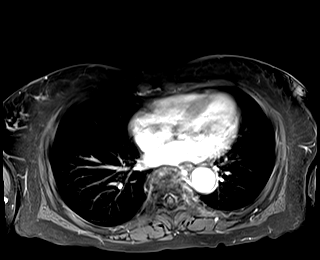

[Series 25: T1 dynamic · axial · 3.0mm · 1.12mm/px · z∈[-200,+12]mm · 3 of 72 slices shown (3 of 5)]
[im 1/72]
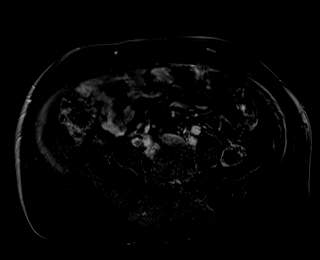
[im 36/72]
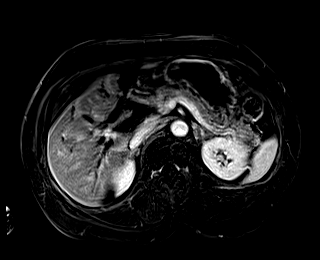
[im 72/72]
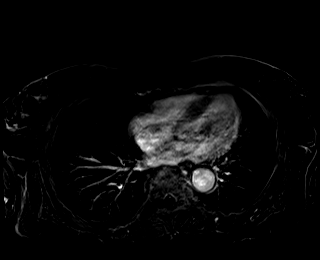

[Series 26: T1 dynamic post-contrast · axial · 3.0mm · 1.12mm/px · z∈[-200,+12]mm · 3 of 72 slices shown (3 of 5)]
[im 1/72]
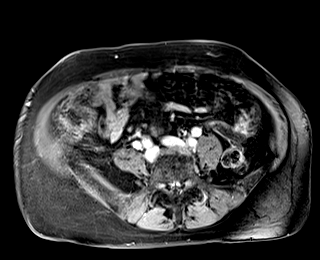
[im 36/72]
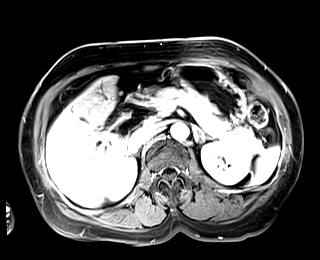
[im 72/72]
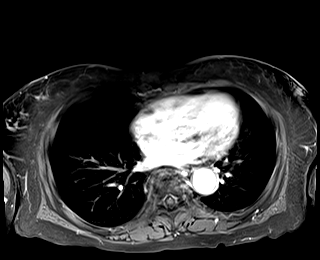

[Series 27: T1 dynamic · axial · 3.0mm · 1.12mm/px · z∈[-200,+12]mm · 3 of 72 slices shown (4 of 5)]
[im 1/72]
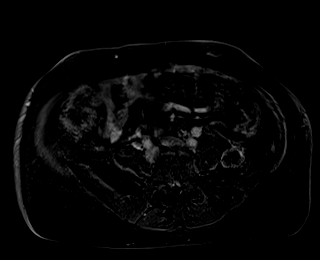
[im 36/72]
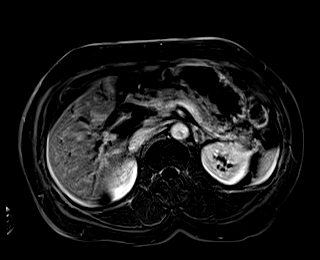
[im 72/72]
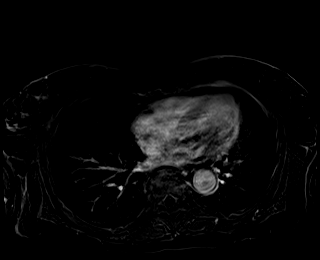

[Series 28: T1 dynamic post-contrast · axial · 3.0mm · 1.12mm/px · z∈[-200,+12]mm · 3 of 72 slices shown (4 of 5)]
[im 1/72]
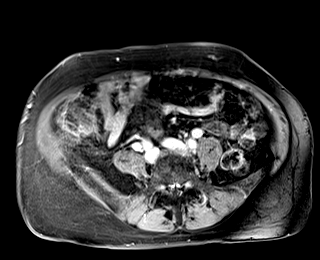
[im 36/72]
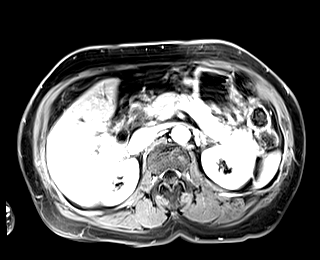
[im 72/72]
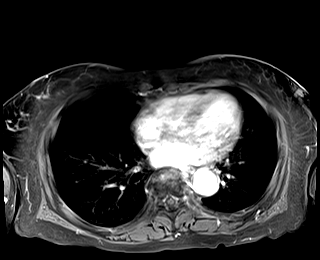

[Series 29: T1 dynamic · axial · 3.0mm · 1.12mm/px · z∈[-200,+12]mm · 3 of 72 slices shown (5 of 5)]
[im 1/72]
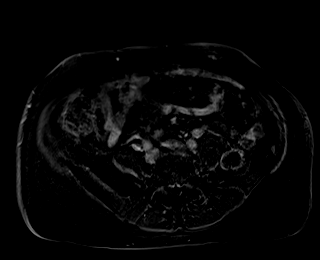
[im 36/72]
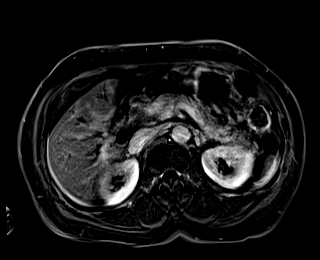
[im 72/72]
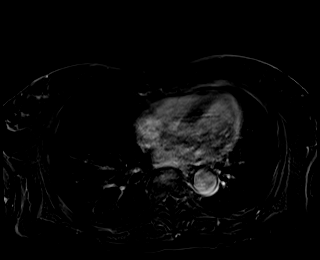

[Series 30: T1 dynamic post-contrast · coronal · 3.0mm · 1.31mm/px · 3 of 72 slices shown (5 of 5)]
[im 1/72]
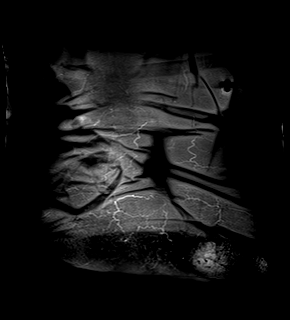
[im 36/72]
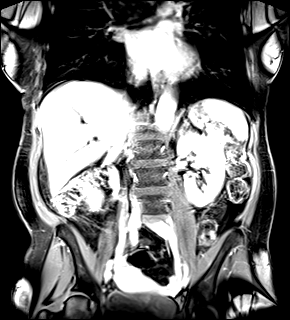
[im 72/72]
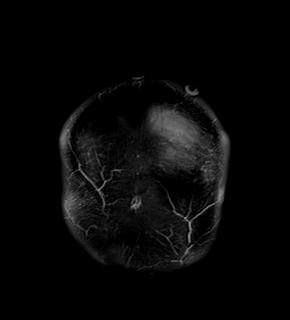

[44 of 48 positions shown; findings below may reference images not displayed]

FINDINGS: Lower chest: No acute findings.

Hepatobiliary: No mass or other parenchymal abnormality identified.
There is severe intra and extrahepatic biliary ductal dilatation.
There is a large gallstone present in the midportion of the common
bile duct, approximately 3.5 cm from the ampulla, measuring 1.5 x
1.3 cm (series 5, image 15). Multiple additional gallstones and
sludge in the gallbladder.

Pancreas: No mass, inflammatory changes, or other parenchymal
abnormality identified. No pancreatic ductal dilatation.

Spleen:  Within normal limits in size and appearance.

Adrenals/Urinary Tract: No masses identified. No evidence of
hydronephrosis.

Stomach/Bowel: Visualized portions within the abdomen are
unremarkable.

Vascular/Lymphatic: No pathologically enlarged lymph nodes
identified. No abdominal aortic aneurysm demonstrated.

Other:  None.

Musculoskeletal: No suspicious bone lesions identified.
IMPRESSION: 1. Severe intra and extrahepatic biliary ductal dilatation. There is
a large gallstone in the midportion of the common bile duct,
approximately 3.5 cm from the ampulla, measuring 1.5 x 1.3 cm.
2. Multiple additional gallstones and sludge in the gallbladder.

Preliminary findings were reported by Dr. Jonsteinn Agaetisson at [DATE] a.m.,
07/22/2019.

## 2021-11-08 ENCOUNTER — Other Ambulatory Visit: Payer: Self-pay | Admitting: Internal Medicine

## 2021-11-08 DIAGNOSIS — Z1231 Encounter for screening mammogram for malignant neoplasm of breast: Secondary | ICD-10-CM

## 2024-05-18 ENCOUNTER — Other Ambulatory Visit: Payer: Self-pay | Admitting: Internal Medicine

## 2024-05-18 DIAGNOSIS — Z1231 Encounter for screening mammogram for malignant neoplasm of breast: Secondary | ICD-10-CM

## 2024-07-05 ENCOUNTER — Encounter
